# Patient Record
Sex: Male | Born: 1955 | Race: White | Hispanic: No | Marital: Married | Smoking: Former smoker
Health system: Southern US, Community
[De-identification: ages and names within clinical notes are randomized; demographics above are authoritative.]

## PROBLEM LIST (undated history)

## (undated) DIAGNOSIS — G8929 Other chronic pain: Secondary | ICD-10-CM

## (undated) DIAGNOSIS — I10 Essential (primary) hypertension: Secondary | ICD-10-CM

## (undated) DIAGNOSIS — M1990 Unspecified osteoarthritis, unspecified site: Secondary | ICD-10-CM

## (undated) HISTORY — DX: Essential (primary) hypertension: I10

## (undated) HISTORY — DX: Unspecified osteoarthritis, unspecified site: M19.90

## (undated) HISTORY — PX: NO PAST SURGERIES: SHX2092

## (undated) HISTORY — DX: Other chronic pain: G89.29

---
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
## 2014-06-07 ENCOUNTER — Inpatient Hospital Stay: Payer: Self-pay | Admitting: Internal Medicine

## 2014-06-07 LAB — URINALYSIS, COMPLETE
BILIRUBIN, UR: NEGATIVE
Blood: NEGATIVE
Glucose,UR: NEGATIVE mg/dL (ref 0–75)
LEUKOCYTE ESTERASE: NEGATIVE
Nitrite: NEGATIVE
Ph: 5 (ref 4.5–8.0)
RBC,UR: 2 /HPF (ref 0–5)
SPECIFIC GRAVITY: 1.023 (ref 1.003–1.030)
Squamous Epithelial: NONE SEEN

## 2014-06-07 LAB — PROTIME-INR
INR: 1
INR: 1.1
Prothrombin Time: 13.1 secs (ref 11.5–14.7)
Prothrombin Time: 13.8 secs (ref 11.5–14.7)

## 2014-06-07 LAB — COMPREHENSIVE METABOLIC PANEL
ANION GAP: 9 (ref 7–16)
Albumin: 3.6 g/dL (ref 3.4–5.0)
Alkaline Phosphatase: 52 U/L
BILIRUBIN TOTAL: 0.6 mg/dL (ref 0.2–1.0)
BUN: 14 mg/dL (ref 7–18)
CALCIUM: 8.5 mg/dL (ref 8.5–10.1)
Chloride: 106 mmol/L (ref 98–107)
Co2: 25 mmol/L (ref 21–32)
Creatinine: 1.1 mg/dL (ref 0.60–1.30)
GLUCOSE: 110 mg/dL — AB (ref 65–99)
Osmolality: 281 (ref 275–301)
Potassium: 3.1 mmol/L — ABNORMAL LOW (ref 3.5–5.1)
SGOT(AST): 20 U/L (ref 15–37)
SGPT (ALT): 26 U/L (ref 12–78)
Sodium: 140 mmol/L (ref 136–145)
Total Protein: 7 g/dL (ref 6.4–8.2)

## 2014-06-07 LAB — CBC WITH DIFFERENTIAL/PLATELET
BASOS PCT: 1.1 %
Basophil #: 0.2 10*3/uL — ABNORMAL HIGH (ref 0.0–0.1)
Basophil #: 0 10*3/uL (ref 0.0–0.1)
Basophil %: 0.3 %
EOS ABS: 0.1 10*3/uL (ref 0.0–0.7)
EOS PCT: 0.1 %
Eosinophil #: 0 10*3/uL (ref 0.0–0.7)
Eosinophil %: 0.9 %
HCT: 42.2 % (ref 40.0–52.0)
HCT: 42.8 % (ref 40.0–52.0)
HGB: 12.8 g/dL — AB (ref 13.0–18.0)
HGB: 13.4 g/dL (ref 13.0–18.0)
LYMPHS PCT: 11.3 %
Lymphocyte #: 4.3 10*3/uL — ABNORMAL HIGH (ref 1.0–3.6)
Lymphocyte #: 1.7 10*3/uL (ref 1.0–3.6)
Lymphocyte %: 29.8 %
MCH: 21.3 pg — AB (ref 26.0–34.0)
MCH: 21.5 pg — ABNORMAL LOW (ref 26.0–34.0)
MCHC: 30.3 g/dL — ABNORMAL LOW (ref 32.0–36.0)
MCHC: 31.2 g/dL — AB (ref 32.0–36.0)
MCV: 70 fL — ABNORMAL LOW (ref 80–100)
MCV: 69 fL — AB (ref 80–100)
Monocyte #: 0.9 x10 3/mm (ref 0.2–1.0)
Monocyte #: 0.9 x10 3/mm (ref 0.2–1.0)
Monocyte %: 6 %
Monocyte %: 5.9 %
NEUTROS PCT: 62.2 %
NEUTROS PCT: 82.4 %
Neutrophil #: 8.9 10*3/uL — ABNORMAL HIGH (ref 1.4–6.5)
Neutrophil #: 12.2 10*3/uL — ABNORMAL HIGH (ref 1.4–6.5)
PLATELETS: 213 10*3/uL (ref 150–440)
Platelet: 266 10*3/uL (ref 150–440)
RBC: 6.01 10*6/uL — AB (ref 4.40–5.90)
RBC: 6.22 10*6/uL — ABNORMAL HIGH (ref 4.40–5.90)
RDW: 16.3 % — ABNORMAL HIGH (ref 11.5–14.5)
RDW: 16.2 % — ABNORMAL HIGH (ref 11.5–14.5)
WBC: 14.4 10*3/uL — ABNORMAL HIGH (ref 3.8–10.6)
WBC: 14.8 10*3/uL — ABNORMAL HIGH (ref 3.8–10.6)

## 2014-06-07 LAB — D-DIMER(ARMC): D-Dimer: 438 ng/ml

## 2014-06-07 LAB — FIBRINOGEN
Fibrinogen: 226 mg/dL (ref 210–470)
Fibrinogen: 222 mg/dL (ref 210–470)

## 2014-06-08 LAB — COMPREHENSIVE METABOLIC PANEL
ALK PHOS: 48 U/L
ALT: 19 U/L (ref 12–78)
Albumin: 3.1 g/dL — ABNORMAL LOW (ref 3.4–5.0)
Anion Gap: 6 — ABNORMAL LOW (ref 7–16)
BUN: 9 mg/dL (ref 7–18)
Bilirubin,Total: 0.9 mg/dL (ref 0.2–1.0)
CREATININE: 0.95 mg/dL (ref 0.60–1.30)
Calcium, Total: 7.8 mg/dL — ABNORMAL LOW (ref 8.5–10.1)
Chloride: 105 mmol/L (ref 98–107)
Co2: 24 mmol/L (ref 21–32)
EGFR (Non-African Amer.): >60
Glucose: 106 mg/dL — ABNORMAL HIGH (ref 65–99)
Osmolality: 269 (ref 275–301)
POTASSIUM: 3.7 mmol/L (ref 3.5–5.1)
SGOT(AST): 17 U/L (ref 15–37)
Sodium: 135 mmol/L — ABNORMAL LOW (ref 136–145)
TOTAL PROTEIN: 6.1 g/dL — AB (ref 6.4–8.2)

## 2014-06-08 LAB — CBC WITH DIFFERENTIAL/PLATELET
BASOS ABS: 0.1 10*3/uL (ref 0.0–0.1)
BASOS PCT: 0.8 %
EOS PCT: 0.9 %
Eosinophil #: 0.1 10*3/uL (ref 0.0–0.7)
HCT: 42.2 % (ref 40.0–52.0)
HGB: 12.9 g/dL — AB (ref 13.0–18.0)
LYMPHS ABS: 3.3 10*3/uL (ref 1.0–3.6)
Lymphocyte %: 26.2 %
MCH: 21.3 pg — AB (ref 26.0–34.0)
MCHC: 30.7 g/dL — ABNORMAL LOW (ref 32.0–36.0)
MCV: 69 fL — AB (ref 80–100)
MONOS PCT: 7.4 %
Monocyte #: 0.9 x10 3/mm (ref 0.2–1.0)
NEUTROS ABS: 8.2 10*3/uL — AB (ref 1.4–6.5)
Neutrophil %: 64.7 %
PLATELETS: 205 10*3/uL (ref 150–440)
RBC: 6.08 10*6/uL — ABNORMAL HIGH (ref 4.40–5.90)
RDW: 15.7 % — ABNORMAL HIGH (ref 11.5–14.5)
WBC: 12.7 10*3/uL — AB (ref 3.8–10.6)

## 2014-06-08 LAB — FIBRINOGEN: FIBRINOGEN: 246 mg/dL (ref 210–470)

## 2014-06-08 LAB — PROTIME-INR
INR: 1
Prothrombin Time: 13.3 secs (ref 11.5–14.7)

## 2014-07-05 ENCOUNTER — Ambulatory Visit: Payer: Self-pay | Attending: Internal Medicine

## 2015-03-16 NOTE — Discharge Summary (Signed)
PATIENT NAME:  Todd BrickRENO, Todd Mcmahon DATE OF BIRTH:  03-16-1956  DATE OF ADMISSION:  06/07/2014 DATE OF DISCHARGE:  06/08/2014  PRESENTING COMPLAINT: Snakebite.   DISCHARGE DIAGNOSES:  1.  Snakebite.  2.  Right hand fifth finger small hemorrhagic blister.   CODE STATUS: FULL CODE.   MEDICATIONS:  1.  Keflex 500 mg b.i.d.  2.  Tylenol/hydrocodone 5/325 one tablet every 4 hours as needed.   FOLLOWUP:  Follow up with urgent care or ER if needed.   LABORATORY DATA:  White count is 12.7, hemoglobin and hematocrit is 12.9 and 42.2, platelet count is 205,000. PT/INR within normal limits. Fibrinogen within normal limits. Urinalysis negative for urinary tract infection. D-dimer is 438.   BRIEF SUMMARY OF HOSPITAL COURSE: [name] is a 59 year old Caucasian gentleman who is a Corporate investment bankerconstruction worker by occupation, who came into the Emergency Room from after he was bitten by a snake on the right fourth finger while he was at work trying to move lumber around.  Patient started having swelling and tingling along with stinging pain in the right finger, came to the Emergency Room, was admitted since his hand appeared swollen and had  developed a hemorrhagic blister. The patient was admitted on the medical floor, received some IV fluids and he also received CroFab antivenom, according to poison control recommendations. His coagulations were monitored.  PT/INR and fibrinogen remained stable. For the patient's swelling, it was recommended to continue to keep his hand elevated. He developed a hemorrhagic blister, which was oozing some.  Did not have any fever, had mild elevated white count, hence Keflex was started empirically which the patient would complete the course. The patient had good radial pulse. There were no signs of compartment syndrome. He was recommended to follow-up in the urgent care or come back to the Emergency Room in case his signed and symptoms worsened. The patient did voice  understanding. He was discharged to home in stable condition. He remained a full code.    TIME SPENT:  40 minutes.   ____________________________ Wylie HailSona A. Allena KatzPatel, MD sap:ts D: 06/09/2014 07:09:42 ET T: 06/09/2014 11:01:35 ET JOB#: [id number]  cc: Darian Cansler A. Allena KatzPatel, MD, <Dictator> Willow OraSONA A Walfred Bettendorf MD ELECTRONICALLY SIGNED 06/19/2014 15:42

## 2015-03-16 NOTE — H&P (Signed)
PATIENT NAME:  Denton BrickRENO, Johney J MR#:  [id number] DATE OF BIRTH:  [birth year]  DATE OF ADMISSION:  06/07/2014  PRIMARY CARE PHYSICIAN: None.  REFERRING PHYSICIAN: Lurena JoinerRebecca L. Lord, MD.  CHIEF COMPLAINT: Snakebite.   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  Today, was working with some lumber in [employer] representative[employer] site and while moving the lumber, he saw a snake underneath, which was a copperhead as he knows it, tried to move fast, but the snake bit him on the 4th finger on his right hand.  Immediately, within  10-15 minutes, he started having swelling of his hand.  He tried to keep his arm and his hand under  running water, but did not help much. He had severe her pain as if hundreds of bees stung his finger and hand, and so decided to come to the Emergency Room.  Within like half an hour, he was in the Emergency Room, and after arrival in ER, his hand continued to keep swelling more and more and it was spreading on his forearm. ER physician spoke to poison control center and started him on CroFab and checked his initial laboratories.  After starting his CroFab, his swelling is gradually going down, so given to hospitalist team for further management of this case.   REVIEW OF SYSTEMS:  CONSTITUTIONAL: Negative for fever, fatigue, weakness, pain or weight loss.  EYES: No blurring, double vision, discharge or redness.  ENT: No tinnitus, ear pain or hearing loss.  RESPIRATORY: No cough, wheezing, hemoptysis, or shortness of breath.  CARDIOVASCULAR: No chest pain, orthopnea, edema, arrhythmia, palpitations.  GASTROINTESTINAL: No nausea, vomiting, diarrhea, abdominal pain.  GENITOURINARY:  No dysuria, hematuria, or increased frequency.  ENDOCRINE: No heat or cold intolerance. No excessive sweating.   SKIN: No acne, rashes, or lesions, but has snakebite on 4th finger on right hand and severe swelling of right hand up to forearm.  MUSCULOSKELETAL: As mentioned above, swelling of the right hand and forearm.   NEUROLOGIC: No numbness, except his right hand which feels numb because of severe swelling. No vertigo, weakness or tingling.   PAST MEDICAL HISTORY: None, not known as does not go to a doctor.   PAST SURGICAL HISTORY: None.   SOCIAL HISTORY: He is a Corporate investment bankerconstruction worker, lives with family. He smokes 1 pack in 2 days and he drinks 1-2 beers every day.  Sometimes when he has back pain, he drinks 5-6 beers to get rid of his pain. He denies any illegal drug use.  He does not have insurance.   FAMILY HISTORY: Positive for colon cancer in his father at age of 60-70 and mother had breast cancer which spread all over.   HOME MEDICATIONS: None.   PHYSICAL EXAMINATION: VITAL SIGNS: In ER, temperature 97.8, pulse is 54, respirations 18, blood pressure 139/90, and pulse oximetry 100% on room air.  GENERAL: The patient is fully alert and oriented to time, place and person. Does not appear in any acute distress.  HEAD:  Atraumatic. Conjunctivae pink.  Oral mucosa  moist.  NECK: Supple. No JVD.  RESPIRATORY: Bilateral equal and clear air entry.  CARDIOVASCULAR: S1, S2 present, regular. No murmur.  ABDOMEN: Soft, nontender. Bowel sounds present. No organomegaly.  SKIN: No rashes, except right hand where there is  sting/bite by snake and somewhat purplish discoloration of his 4th finger with severe swelling on his hand and forearm.  MUSCULOSKELETAL: As mentioned above on right hand and forearm; otherwise, no joint swelling or tenderness.  NEUROLOGICAL: Power 5/5. Moves all 4 limbs except right hand, which is limited movement because of swelling and he is numb on the fingers and hand.  Pulsations present on the right side for radial artery. No tremor or rigidity.  PSYCHIATRIC: Does not appear in any  acute psychiatric illness at this time.   IMPORTANT LABORATORY RESULTS: Glucose 110, BUN 14, creatinine 1.10, sodium 140, potassium is 3.1, chloride 106, CO2 25. Calcium 8.5, total protein 7, bilirubin 0.6, alkaline phosphate 52, SGOT 20 and SGPT is 26. WBC 14.4, hemoglobin 12.8, platelet count is 266,000, and MCV is 70. INR 1 and fibrinogen is 226.  Urinalysis is negative.   ASSESSMENT AND PLAN: A 59 year old man with no known past medical history, came to Emergency Room after the bite by copperhead snake, severe swelling and pain on his right hand, already started on antisnake venom and spoke to [employer].  1.  Snake bite copperhead poisonous snake. Started on CroFab antisnake venom by ER,  According to the recommendation of Poison Control Center and his swelling is already coming down on the right hand.  As [employer] suggested, we will check CBC, INR and fibrinogen every  6 hours and if his levels get worse or his swelling get worse, we need to call the [facility] and maybe restart a second drip of antisnake venom. Otherwise, he we can just keep him on observation.  After finishing this drip, checking every 6 hours CBC, INR and fibrinogen if it is fine, he  should be able to discharge tomorrow.  2.  Severe pain due to snake bite.  Will give morphine injection for this pain control.  3.  Hypokalemia. We will give him for potassium oral and check it tomorrow.  4.  Smoking. Smoking cessation counseling is done for 4 minutes and offered a nicotine patch. He is okay without smoking and does not want nicotine patch here.   CODE STATUS: Full code.   TOTAL TIME SPENT ON THIS ADMISSION: 50 minutes    ____________________________ Hope Pigeon Elisabeth Pigeon, MD vgv:ds D: 06/07/2014 16:24:08 ET T: 06/07/2014 17:13:49 ET JOB#: [id number]  cc: Hope Pigeon. Elisabeth Pigeon, MD, <Dictator> Altamese Dilling MD ELECTRONICALLY SIGNED 06/08/2014 12:40

## 2020-02-03 ENCOUNTER — Ambulatory Visit: Payer: Self-pay | Attending: Internal Medicine

## 2020-02-03 ENCOUNTER — Other Ambulatory Visit: Payer: Self-pay

## 2020-02-03 DIAGNOSIS — Z23 Encounter for immunization: Secondary | ICD-10-CM

## 2020-02-03 NOTE — Progress Notes (Signed)
   Covid-19 Vaccination Clinic  Name:  Todd Mcmahon    MRN: [id number] DOB: 03/28/1956  02/03/2020  [name] was observed post Covid-19 immunization for 15 minutes without incident. He was provided with Vaccine Information Sheet and instruction to access the V-Safe system.   [name] was instructed to call 911 with any severe reactions post vaccine: Marland Kitchen Difficulty breathing  . Swelling of face and throat  . A fast heartbeat  . A bad rash all over body  . Dizziness and weakness   Immunizations Administered    Name Date Dose VIS Date Route   Pfizer COVID-19 Vaccine 02/03/2020 10:27 AM 0.3 mL 11/03/2019 Intramuscular   Manufacturer: [employer], [employer]   Lot: [id number]   NDC: [address]-5

## 2020-02-28 ENCOUNTER — Ambulatory Visit: Payer: Self-pay | Attending: Internal Medicine

## 2020-02-28 DIAGNOSIS — Z23 Encounter for immunization: Secondary | ICD-10-CM

## 2020-02-28 NOTE — Progress Notes (Signed)
   Covid-19 Vaccination Clinic  Name:  Todd Mcmahon    MRN: [id number] DOB: 1956/03/03  02/28/2020  [name] was observed post Covid-19 immunization for 15 minutes without incident. He was provided with Vaccine Information Sheet and instruction to access the V-Safe system.   [name] was instructed to call 911 with any severe reactions post vaccine: Marland Kitchen Difficulty breathing  . Swelling of face and throat  . A fast heartbeat  . A bad rash all over body  . Dizziness and weakness   Immunizations Administered    Name Date Dose VIS Date Route   Pfizer COVID-19 Vaccine 02/28/2020 10:25 AM 0.3 mL 11/03/2019 Intramuscular   Manufacturer: [employer], [employer]   Lot: [id number]   NDC: [address]-7

## 2020-10-30 ENCOUNTER — Telehealth: Payer: Self-pay | Admitting: General Practice

## 2020-10-30 NOTE — Telephone Encounter (Signed)
Ok to schedule in open slot  Will likely need to wait to 2022 for availability. Thanks!

## 2020-10-30 NOTE — Telephone Encounter (Signed)
Pt wife called in wanted to know if he [facility] will accept him due to his wife and child goes there [facility])

## 2020-11-05 ENCOUNTER — Encounter: Payer: Self-pay | Admitting: Family Medicine

## 2020-11-05 NOTE — Telephone Encounter (Signed)
Updated pt's chart.  

## 2021-01-10 ENCOUNTER — Encounter: Payer: Self-pay | Admitting: Family Medicine

## 2021-01-10 ENCOUNTER — Ambulatory Visit (INDEPENDENT_AMBULATORY_CARE_PROVIDER_SITE_OTHER): Payer: Medicare HMO | Admitting: Family Medicine

## 2021-01-10 ENCOUNTER — Other Ambulatory Visit: Payer: Self-pay

## 2021-01-10 VITALS — BP 164/84 | HR 67 | Temp 97.7°F | Ht 67.5 in | Wt 177.1 lb

## 2021-01-10 DIAGNOSIS — F17200 Nicotine dependence, unspecified, uncomplicated: Secondary | ICD-10-CM

## 2021-01-10 DIAGNOSIS — R030 Elevated blood-pressure reading, without diagnosis of hypertension: Secondary | ICD-10-CM

## 2021-01-10 DIAGNOSIS — M545 Low back pain, unspecified: Secondary | ICD-10-CM

## 2021-01-10 DIAGNOSIS — M1990 Unspecified osteoarthritis, unspecified site: Secondary | ICD-10-CM

## 2021-01-10 DIAGNOSIS — G8929 Other chronic pain: Secondary | ICD-10-CM

## 2021-01-10 NOTE — Progress Notes (Signed)
Patient ID: Todd Mcmahon, male    DOB: 1956/03/04, 65 y.o.   MRN: [id number]  This visit was conducted in person.  BP (!) 164/84 (BP Location: Right Arm, Cuff Size: Large)   Pulse 67   Temp 97.7 F (36.5 C) (Temporal)   Ht 5' 7.5" (1.715 m)   Wt 177 lb 2 oz (80.3 kg)   SpO2 96%   BMI 27.33 kg/m    CC: new pt to establish care  Subjective:   HPI: Todd Mcmahon is a 65 y.o. male presenting on 01/10/2021 for New Patient (Initial Visit)   I see patient's wife and daughter.  Has not seen doctor in a long time.  Just received Medicare.   Hypertension - no known h/o hypertension. No HA, vision changes, CP/tightness, SOB, leg swelling.   Chronic arthritis and back pain ?gout.   Current 1/2 ppd every day smoker - started age 23 yo (~24 PY hx). Has quit intermittently. Has tried gum and other OTC NRT. Patches seemed to help.   H/o GI bleed 1978 while on ibuprofen.  Remotely told has spondylolisthesis.   Lives with wife Todd Mcmahon Occ: Builder Edu: HS  Activity: stays active at work Diet: good water, fruits/vegetables daily.      Relevant past medical, surgical, family and social history reviewed and updated as indicated. Interim medical history since our last visit reviewed. Allergies and medications reviewed and updated. No outpatient medications prior to visit.   No facility-administered medications prior to visit.     Per HPI unless specifically indicated in ROS section below Review of Systems Objective:  BP (!) 164/84 (BP Location: Right Arm, Cuff Size: Large)   Pulse 67   Temp 97.7 F (36.5 C) (Temporal)   Ht 5' 7.5" (1.715 m)   Wt 177 lb 2 oz (80.3 kg)   SpO2 96%   BMI 27.33 kg/m   Wt Readings from Last 3 Encounters:  01/10/21 177 lb 2 oz (80.3 kg)      Physical Exam Vitals and nursing note reviewed.  Constitutional:      Appearance: Normal appearance. He is not ill-appearing.  Eyes:     Extraocular Movements: Extraocular movements intact.      Conjunctiva/sclera: Conjunctivae normal.     Pupils: Pupils are equal, round, and reactive to light.  Neck:     Thyroid: No thyroid mass, thyromegaly or thyroid tenderness.  Cardiovascular:     Rate and Rhythm: Normal rate and regular rhythm.     Pulses: Normal pulses.     Heart sounds: Normal heart sounds. No murmur heard.   Pulmonary:     Effort: Pulmonary effort is normal. No respiratory distress.     Breath sounds: Normal breath sounds. No wheezing, rhonchi or rales.  Musculoskeletal:     Cervical back: Normal range of motion and neck supple.     Right lower leg: No edema.     Left lower leg: No edema.  Skin:    General: Skin is warm and dry.     Findings: No rash.  Neurological:     Mental Status: He is alert.  Psychiatric:        Mood and Affect: Mood normal.        Behavior: Behavior normal.       No results found for this or any previous visit.  Depression screen [facility] 2/9 01/10/2021  Decreased Interest 0  Down, Depressed, Hopeless 0  PHQ - 2 Score 0  Altered sleeping  1  Tired, decreased energy 0  Change in appetite 0  Feeling bad or failure about yourself  0  Trouble concentrating 0  Moving slowly or fidgety/restless 0  Suicidal thoughts 0  PHQ-9 Score 1    GAD 7 : Generalized Anxiety Score 01/10/2021  Nervous, Anxious, on Edge 0  Control/stop worrying 0  Worry too much - different things 0  Trouble relaxing 0  Restless 0  Easily annoyed or irritable 0  Afraid - awful might happen 0  Total GAD 7 Score 0   Assessment & Plan:  This visit occurred during the SARS-CoV-2 public health emergency.  Safety protocols were in place, including screening questions prior to the visit, additional usage of staff PPE, and extensive cleaning of exam room while observing appropriate contact time as indicated for disinfecting solutions.   Problem List Items Addressed This Visit    Smoker    Current 1/2 ppd smoker for ~50 yrs - will qualify for lung cancer screening [place]  program - will review next visit.  Contemplative - discussed retrial nicotine patches.       Elevated systolic blood pressure reading without diagnosis of hypertension - Primary    No recent medical evaluation. BP remaining elevated in office today. Reviewed hypertension as well as reasons to treat. rec low sodium high potassium diet and regular aerobic exercises to help lower BP. Provided DASH diet handout. Advised buy BP cuff and start monitoring at home, let me know if persistently >!40/90 to start antihypertensive. Reassess at next visit - welcome to medicare visit in 1-2 months.       Chronic back pain    Endorses remote h/o spondylolisthesis      Arthritis    Chronic, does not take medication for this.  Will need to r/o gout.           No orders of the defined types were placed in this encounter.  No orders of the defined types were placed in this encounter.   Patient instructions: Try nicoderm CQ 14 mg patch daily to help quit smoking.  Return for welcome to medicare visit in 1-2 months.  Blood pressures is staying too high. Your goal blood pressure is 140<90. Work on low salt/sodium diet - goal <1.5gm (1,500mg ) per day. Eat a diet high in fruits/vegetables and whole grains.  Look into mediterranean and DASH diet.  Goal activity is 125min/wk of moderate intensity exercise.  This can be split into 30 minute chunks.  If you are not at this level, you can start with smaller 10-15 min increments and slowly build up activity. Look at www.heart.org for more resources   Follow up plan: Return in about 6 weeks (around 02/21/2021), or if symptoms worsen or fail to improve, for annual exam, prior fasting for blood work.  Todd Boyden, MD

## 2021-01-10 NOTE — Patient Instructions (Addendum)
Try nicoderm CQ 14 mg patch daily to help quit smoking.  Return for welcome to medicare visit in 1-2 months.  Blood pressures is staying too high. Your goal blood pressure is 140<90. Work on low salt/sodium diet - goal <1.5gm (1,500mg ) per day. Eat a diet high in fruits/vegetables and whole grains.  Look into mediterranean and DASH diet.  Goal activity is 175min/wk of moderate intensity exercise.  This can be split into 30 minute chunks.  If you are not at this level, you can start with smaller 10-15 min increments and slowly build up activity. Look at www.heart.org for more resources   https://www.mata.com/.pdf">  DASH Eating Plan DASH stands for Dietary Approaches to Stop Hypertension. The DASH eating plan is a healthy eating plan that has been shown to:  Reduce high blood pressure (hypertension).  Reduce your risk for type 2 diabetes, heart disease, and stroke.  Help with weight loss. What are tips for following this plan? Reading food labels  Check food labels for the amount of salt (sodium) per serving. Choose foods with less than 5 percent of the Daily Value of sodium. Generally, foods with less than 300 milligrams (mg) of sodium per serving fit into this eating plan.  To find whole grains, look for the word "whole" as the first word in the ingredient list. Shopping  Buy products labeled as "low-sodium" or "no salt added."  Buy fresh foods. Avoid canned foods and pre-made or frozen meals. Cooking  Avoid adding salt when cooking. Use salt-free seasonings or herbs instead of table salt or sea salt. Check with your health care provider or pharmacist before using salt substitutes.  Do not fry foods. Cook foods using healthy methods such as baking, boiling, grilling, roasting, and broiling instead.  Cook with heart-healthy oils, such as olive, canola, avocado, soybean, or sunflower oil. Meal planning  Eat a balanced diet that  includes: ? 4 or more servings of fruits and 4 or more servings of vegetables each day. Try to fill one-half of your plate with fruits and vegetables. ? 6-8 servings of whole grains each day. ? Less than 6 oz (170 g) of lean meat, poultry, or fish each day. A 3-oz (85-g) serving of meat is about the same size as a deck of cards. One egg equals 1 oz (28 g). ? 2-3 servings of low-fat dairy each day. One serving is 1 cup (237 mL). ? 1 serving of nuts, seeds, or beans 5 times each week. ? 2-3 servings of heart-healthy fats. Healthy fats called omega-3 fatty acids are found in foods such as walnuts, flaxseeds, fortified milks, and eggs. These fats are also found in cold-water fish, such as sardines, salmon, and mackerel.  Limit how much you eat of: ? Canned or prepackaged foods. ? Food that is high in trans fat, such as some fried foods. ? Food that is high in saturated fat, such as fatty meat. ? Desserts and other sweets, sugary drinks, and other foods with added sugar. ? Full-fat dairy products.  Do not salt foods before eating.  Do not eat more than 4 egg yolks a week.  Try to eat at least 2 vegetarian meals a week.  Eat more home-cooked food and less restaurant, buffet, and fast food.   Lifestyle  When eating at a restaurant, ask that your food be prepared with less salt or no salt, if possible.  If you drink alcohol: ? Limit how much you use to:  0-1 drink a day for women  who are not pregnant.  0-2 drinks a day for men. ? Be aware of how much alcohol is in your drink. In the [place] one drink equals one 12 oz bottle of beer (355 mL), one 5 oz glass of wine (148 mL), or one 1 oz glass of hard liquor (44 mL). General information  Avoid eating more than 2,300 mg of salt a day. If you have hypertension, you may need to reduce your sodium intake to 1,500 mg a day.  Work with your health care provider to maintain a healthy body weight or to lose weight. Ask what an ideal weight is for  you.  Get at least 30 minutes of exercise that causes your heart to beat faster (aerobic exercise) most days of the week. Activities may include walking, swimming, or biking.  Work with your health care provider or dietitian to adjust your eating plan to your individual calorie needs. What foods should I eat? Fruits All fresh, dried, or frozen fruit. Canned fruit in natural juice (without added sugar). Vegetables Fresh or frozen vegetables (raw, steamed, roasted, or grilled). Low-sodium or reduced-sodium tomato and vegetable juice. Low-sodium or reduced-sodium tomato sauce and tomato paste. Low-sodium or reduced-sodium canned vegetables. Grains Whole-grain or whole-wheat bread. Whole-grain or whole-wheat pasta. Brown rice. Orpah Cobb. Bulgur. Whole-grain and low-sodium cereals. Pita bread. Low-fat, low-sodium crackers. Whole-wheat flour tortillas. Meats and other proteins Skinless chicken or [place]. Ground chicken or [place]. Pork with fat trimmed off. Fish and seafood. Egg whites. Dried beans, peas, or lentils. Unsalted nuts, nut butters, and seeds. Unsalted canned beans. Lean cuts of beef with fat trimmed off. Low-sodium, lean precooked or cured meat, such as sausages or meat loaves. Dairy Low-fat (1%) or fat-free (skim) milk. Reduced-fat, low-fat, or fat-free cheeses. Nonfat, low-sodium ricotta or cottage cheese. Low-fat or nonfat yogurt. Low-fat, low-sodium cheese. Fats and oils Soft margarine without trans fats. Vegetable oil. Reduced-fat, low-fat, or light mayonnaise and salad dressings (reduced-sodium). Canola, safflower, olive, avocado, soybean, and sunflower oils. Avocado. Seasonings and condiments Herbs. Spices. Seasoning mixes without salt. Other foods Unsalted popcorn and pretzels. Fat-free sweets. The items listed above may not be a complete list of foods and beverages you can eat. Contact a dietitian for more information. What foods should I avoid? Fruits Canned fruit in a  light or heavy syrup. Fried fruit. Fruit in cream or butter sauce. Vegetables Creamed or fried vegetables. Vegetables in a cheese sauce. Regular canned vegetables (not low-sodium or reduced-sodium). Regular canned tomato sauce and paste (not low-sodium or reduced-sodium). Regular tomato and vegetable juice (not low-sodium or reduced-sodium). Rosita Fire. Olives. Grains Baked goods made with fat, such as croissants, muffins, or some breads. Dry pasta or rice meal packs. Meats and other proteins Fatty cuts of meat. Ribs. Fried meat. Tomasa Blase. Bologna, salami, and other precooked or cured meats, such as sausages or meat loaves. Fat from the back of a pig (fatback). Bratwurst. Salted nuts and seeds. Canned beans with added salt. Canned or smoked fish. Whole eggs or egg yolks. Chicken or [place] with skin. Dairy Whole or 2% milk, cream, and half-and-half. Whole or full-fat cream cheese. Whole-fat or sweetened yogurt. Full-fat cheese. Nondairy creamers. Whipped toppings. Processed cheese and cheese spreads. Fats and oils Butter. Stick margarine. Lard. Shortening. Ghee. Bacon fat. Tropical oils, such as coconut, palm kernel, or palm oil. Seasonings and condiments Onion salt, garlic salt, seasoned salt, table salt, and sea salt. Worcestershire sauce. Tartar sauce. Barbecue sauce. Teriyaki sauce. Soy sauce, including reduced-sodium. Steak sauce. Canned and  packaged gravies. Fish sauce. Oyster sauce. Cocktail sauce. Store-bought horseradish. Ketchup. Mustard. Meat flavorings and tenderizers. Bouillon cubes. Hot sauces. Pre-made or packaged marinades. Pre-made or packaged taco seasonings. Relishes. Regular salad dressings. Other foods Salted popcorn and pretzels. The items listed above may not be a complete list of foods and beverages you should avoid. Contact a dietitian for more information. Where to find more information  National Heart, Lung, and Blood Institute: PopSteam.is  American Heart Association:  www.heart.org  Academy of Nutrition and Dietetics: www.eatright.org  National Kidney Foundation: www.kidney.org Summary  The DASH eating plan is a healthy eating plan that has been shown to reduce high blood pressure (hypertension). It may also reduce your risk for type 2 diabetes, heart disease, and stroke.  When on the DASH eating plan, aim to eat more fresh fruits and vegetables, whole grains, lean proteins, low-fat dairy, and heart-healthy fats.  With the DASH eating plan, you should limit salt (sodium) intake to 2,300 mg a day. If you have hypertension, you may need to reduce your sodium intake to 1,500 mg a day.  Work with your health care provider or dietitian to adjust your eating plan to your individual calorie needs. This information is not intended to replace advice given to you by your health care provider. Make sure you discuss any questions you have with your health care provider. Document Revised: 10/13/2019 Document Reviewed: 10/13/2019 Elsevier Patient Education  2021 [employer].

## 2021-01-11 ENCOUNTER — Encounter: Payer: Self-pay | Admitting: Family Medicine

## 2021-01-11 DIAGNOSIS — M1990 Unspecified osteoarthritis, unspecified site: Secondary | ICD-10-CM | POA: Insufficient documentation

## 2021-01-11 DIAGNOSIS — R030 Elevated blood-pressure reading, without diagnosis of hypertension: Secondary | ICD-10-CM | POA: Insufficient documentation

## 2021-01-11 DIAGNOSIS — I10 Essential (primary) hypertension: Secondary | ICD-10-CM | POA: Insufficient documentation

## 2021-01-11 DIAGNOSIS — G8929 Other chronic pain: Secondary | ICD-10-CM | POA: Insufficient documentation

## 2021-01-11 NOTE — Assessment & Plan Note (Signed)
Endorses remote h/o spondylolisthesis

## 2021-01-11 NOTE — Assessment & Plan Note (Addendum)
Current 1/2 ppd smoker for ~50 yrs - will qualify for lung cancer screening [place] program - will review next visit.  Contemplative - discussed retrial nicotine patches.

## 2021-01-11 NOTE — Assessment & Plan Note (Signed)
Chronic, does not take medication for this.  Will need to r/o gout.

## 2021-01-11 NOTE — Assessment & Plan Note (Signed)
No recent medical evaluation. BP remaining elevated in office today. Reviewed hypertension as well as reasons to treat. rec low sodium high potassium diet and regular aerobic exercises to help lower BP. Provided DASH diet handout. Advised buy BP cuff and start monitoring at home, let me know if persistently >!40/90 to start antihypertensive. Reassess at next visit - welcome to medicare visit in 1-2 months.

## 2021-02-08 ENCOUNTER — Encounter: Payer: Self-pay | Admitting: Family Medicine

## 2021-02-11 NOTE — Telephone Encounter (Signed)
Form printed and placed in your in box

## 2021-02-14 NOTE — Telephone Encounter (Signed)
Faxed form.  Made copy to scan and mailed original to pt.

## 2021-03-23 DIAGNOSIS — U071 COVID-19: Secondary | ICD-10-CM

## 2021-03-23 HISTORY — DX: COVID-19: U07.1

## 2021-04-18 DIAGNOSIS — Z20822 Contact with and (suspected) exposure to covid-19: Secondary | ICD-10-CM | POA: Diagnosis not present

## 2021-04-19 ENCOUNTER — Encounter: Payer: Self-pay | Admitting: Family Medicine

## 2021-04-22 ENCOUNTER — Encounter: Payer: Self-pay | Admitting: Family Medicine

## 2021-04-22 NOTE — Telephone Encounter (Signed)
Noted.  FYI to Dr. G. 

## 2021-04-22 NOTE — Telephone Encounter (Signed)
Pt declined appointment at this time. Wife states he is not having any symptoms and does not feel he needs appointment.

## 2021-04-22 NOTE — Telephone Encounter (Addendum)
Plz add pt today as MyChart visit at 1:00.  Pt not aware.  (Dr. Reece Agar will see pt and wife together.)

## 2021-07-11 ENCOUNTER — Ambulatory Visit: Payer: Medicare HMO

## 2021-07-16 ENCOUNTER — Other Ambulatory Visit: Payer: Self-pay | Admitting: Family Medicine

## 2021-07-16 DIAGNOSIS — Z125 Encounter for screening for malignant neoplasm of prostate: Secondary | ICD-10-CM

## 2021-07-16 DIAGNOSIS — Z131 Encounter for screening for diabetes mellitus: Secondary | ICD-10-CM

## 2021-07-16 DIAGNOSIS — D509 Iron deficiency anemia, unspecified: Secondary | ICD-10-CM | POA: Insufficient documentation

## 2021-07-16 DIAGNOSIS — Z13220 Encounter for screening for lipoid disorders: Secondary | ICD-10-CM

## 2021-07-16 DIAGNOSIS — D563 Thalassemia minor: Secondary | ICD-10-CM | POA: Insufficient documentation

## 2021-07-16 DIAGNOSIS — R718 Other abnormality of red blood cells: Secondary | ICD-10-CM

## 2021-07-16 DIAGNOSIS — Z1159 Encounter for screening for other viral diseases: Secondary | ICD-10-CM

## 2021-07-18 ENCOUNTER — Other Ambulatory Visit (INDEPENDENT_AMBULATORY_CARE_PROVIDER_SITE_OTHER): Payer: Medicare HMO

## 2021-07-18 ENCOUNTER — Other Ambulatory Visit: Payer: Self-pay

## 2021-07-18 DIAGNOSIS — Z13220 Encounter for screening for lipoid disorders: Secondary | ICD-10-CM | POA: Diagnosis not present

## 2021-07-18 DIAGNOSIS — R718 Other abnormality of red blood cells: Secondary | ICD-10-CM | POA: Diagnosis not present

## 2021-07-18 DIAGNOSIS — Z1159 Encounter for screening for other viral diseases: Secondary | ICD-10-CM | POA: Diagnosis not present

## 2021-07-18 DIAGNOSIS — Z131 Encounter for screening for diabetes mellitus: Secondary | ICD-10-CM

## 2021-07-18 DIAGNOSIS — Z125 Encounter for screening for malignant neoplasm of prostate: Secondary | ICD-10-CM

## 2021-07-18 LAB — BASIC METABOLIC PANEL
BUN: 15 mg/dL (ref 6–23)
CO2: 28 mEq/L (ref 19–32)
Calcium: 9.1 mg/dL (ref 8.4–10.5)
Chloride: 102 mEq/L (ref 96–112)
Creatinine, Ser: 0.95 mg/dL (ref 0.40–1.50)
GFR: 83.91 mL/min (ref >60.00)
Glucose, Bld: 111 mg/dL — ABNORMAL HIGH (ref 70–99)
Potassium: 5.4 mEq/L — ABNORMAL HIGH (ref 3.5–5.1)
Sodium: 138 mEq/L (ref 135–145)

## 2021-07-18 LAB — PSA: PSA: 1.83 ng/mL (ref 0.10–4.00)

## 2021-07-18 LAB — LIPID PANEL
Cholesterol: 142 mg/dL (ref 0–200)
HDL: 57.2 mg/dL (ref >39.00)
LDL Cholesterol: 71 mg/dL (ref 0–99)
NonHDL: 85.16
Total CHOL/HDL Ratio: 2
Triglycerides: 71 mg/dL (ref 0.0–149.0)
VLDL: 14.2 mg/dL (ref 0.0–40.0)

## 2021-07-18 LAB — CBC WITH DIFFERENTIAL/PLATELET
Basophils Absolute: 0 10*3/uL (ref 0.0–0.1)
Basophils Relative: 0.5 % (ref 0.0–3.0)
Eosinophils Absolute: 0.1 10*3/uL (ref 0.0–0.7)
Eosinophils Relative: 1.2 % (ref 0.0–5.0)
HCT: 39.1 % (ref 39.0–52.0)
Hemoglobin: 12 g/dL — ABNORMAL LOW (ref 13.0–17.0)
Lymphocytes Relative: 24.8 % (ref 12.0–46.0)
Lymphs Abs: 2.2 10*3/uL (ref 0.7–4.0)
MCHC: 30.8 g/dL (ref 30.0–36.0)
MCV: 71.2 fl — ABNORMAL LOW (ref 78.0–100.0)
Monocytes Absolute: 0.9 10*3/uL (ref 0.1–1.0)
Monocytes Relative: 9.5 % (ref 3.0–12.0)
Neutro Abs: 5.8 10*3/uL (ref 1.4–7.7)
Neutrophils Relative %: 64 % (ref 43.0–77.0)
Platelets: 295 10*3/uL (ref 150.0–400.0)
RBC: 5.5 Mil/uL (ref 4.22–5.81)
RDW: 15.7 % — ABNORMAL HIGH (ref 11.5–15.5)
WBC: 9 10*3/uL (ref 4.0–10.5)

## 2021-07-18 LAB — IBC PANEL
Iron: 33 ug/dL — ABNORMAL LOW (ref 42–165)
Saturation Ratios: 11.7 % — ABNORMAL LOW (ref 20.0–50.0)
TIBC: 281.4 ug/dL (ref 250.0–450.0)
Transferrin: 201 mg/dL — ABNORMAL LOW (ref 212.0–360.0)

## 2021-07-18 LAB — FERRITIN: Ferritin: 204.9 ng/mL (ref 22.0–322.0)

## 2021-07-21 LAB — HEPATITIS C ANTIBODY
Hepatitis C Ab: NONREACTIVE
SIGNAL TO CUT-OFF: 0.01 (ref <1.00)

## 2021-07-25 ENCOUNTER — Encounter: Payer: Self-pay | Admitting: Family Medicine

## 2021-07-25 ENCOUNTER — Ambulatory Visit (INDEPENDENT_AMBULATORY_CARE_PROVIDER_SITE_OTHER): Payer: Medicare HMO | Admitting: Family Medicine

## 2021-07-25 ENCOUNTER — Other Ambulatory Visit: Payer: Self-pay

## 2021-07-25 VITALS — BP 126/78 | HR 38 | Temp 97.6°F | Ht 68.0 in | Wt 172.0 lb

## 2021-07-25 DIAGNOSIS — K4090 Unilateral inguinal hernia, without obstruction or gangrene, not specified as recurrent: Secondary | ICD-10-CM

## 2021-07-25 DIAGNOSIS — M545 Low back pain, unspecified: Secondary | ICD-10-CM | POA: Diagnosis not present

## 2021-07-25 DIAGNOSIS — I493 Ventricular premature depolarization: Secondary | ICD-10-CM

## 2021-07-25 DIAGNOSIS — Z7189 Other specified counseling: Secondary | ICD-10-CM

## 2021-07-25 DIAGNOSIS — Z23 Encounter for immunization: Secondary | ICD-10-CM

## 2021-07-25 DIAGNOSIS — M1990 Unspecified osteoarthritis, unspecified site: Secondary | ICD-10-CM | POA: Diagnosis not present

## 2021-07-25 DIAGNOSIS — D509 Iron deficiency anemia, unspecified: Secondary | ICD-10-CM | POA: Diagnosis not present

## 2021-07-25 DIAGNOSIS — F17200 Nicotine dependence, unspecified, uncomplicated: Secondary | ICD-10-CM | POA: Diagnosis not present

## 2021-07-25 DIAGNOSIS — G8929 Other chronic pain: Secondary | ICD-10-CM

## 2021-07-25 DIAGNOSIS — R718 Other abnormality of red blood cells: Secondary | ICD-10-CM

## 2021-07-25 DIAGNOSIS — R030 Elevated blood-pressure reading, without diagnosis of hypertension: Secondary | ICD-10-CM

## 2021-07-25 DIAGNOSIS — Z0000 Encounter for general adult medical examination without abnormal findings: Secondary | ICD-10-CM | POA: Diagnosis not present

## 2021-07-25 DIAGNOSIS — Z136 Encounter for screening for cardiovascular disorders: Secondary | ICD-10-CM

## 2021-07-25 LAB — COMPREHENSIVE METABOLIC PANEL
ALT: 13 U/L (ref 0–53)
AST: 14 U/L (ref 0–37)
Albumin: 3.8 g/dL (ref 3.5–5.2)
Alkaline Phosphatase: 49 U/L (ref 39–117)
BUN: 11 mg/dL (ref 6–23)
CO2: 28 mEq/L (ref 19–32)
Calcium: 9.1 mg/dL (ref 8.4–10.5)
Chloride: 102 mEq/L (ref 96–112)
Creatinine, Ser: 0.85 mg/dL (ref 0.40–1.50)
GFR: 91.08 mL/min (ref >60.00)
Glucose, Bld: 102 mg/dL — ABNORMAL HIGH (ref 70–99)
Potassium: 4.1 mEq/L (ref 3.5–5.1)
Sodium: 137 mEq/L (ref 135–145)
Total Bilirubin: 0.6 mg/dL (ref 0.2–1.2)
Total Protein: 6.9 g/dL (ref 6.0–8.3)

## 2021-07-25 LAB — URIC ACID: Uric Acid, Serum: 8.1 mg/dL — ABNORMAL HIGH (ref 4.0–7.8)

## 2021-07-25 NOTE — Patient Instructions (Addendum)
Prevnar-20 today  Repeat labs today  We will refer you to lung cancer screening program.  We will refer you for colon cancer screening.  We will refer you to general surgery for inguinal hernias If interested, check with pharmacy about new 2 shot shingles series (shingrix).  Congratulations on almost quitting smoking!  Bring [place] a copy of your advanced directive/living will.  Return in 6 months for follow up visit.   Health Maintenance After Age 66 After age 31, you are at a higher risk for certain long-term diseases and infections as well as injuries from falls. Falls are a major cause of broken bones and head injuries in people who are older than age 6. Getting regular preventive care can help to keep you healthy and well. Preventive care includes getting regular testing and making lifestyle changes as recommended by your health care provider. Talk with your health care provider about: Which screenings and tests you should have. A screening is a test that checks for a disease when you have no symptoms. A diet and exercise plan that is right for you. What should I know about screenings and tests to prevent falls? Screening and testing are the best ways to find a health problem early. Early diagnosis and treatment give you the best chance of managing medical conditions that are common after age 52. Certain conditions and lifestyle choices may make you more likely to have a fall. Your health care provider may recommend: Regular vision checks. Poor vision and conditions such as cataracts can make you more likely to have a fall. If you wear glasses, make sure to get your prescription updated if your vision changes. Medicine review. Work with your health care provider to regularly review all of the medicines you are taking, including over-the-counter medicines. Ask your health care provider about any side effects that may make you more likely to have a fall. Tell your health care provider if any  medicines that you take make you feel dizzy or sleepy. Osteoporosis screening. Osteoporosis is a condition that causes the bones to get weaker. This can make the bones weak and cause them to break more easily. Blood pressure screening. Blood pressure changes and medicines to control blood pressure can make you feel dizzy. Strength and balance checks. Your health care provider may recommend certain tests to check your strength and balance while standing, walking, or changing positions. Foot health exam. Foot pain and numbness, as well as not wearing proper footwear, can make you more likely to have a fall. Depression screening. You may be more likely to have a fall if you have a fear of falling, feel emotionally low, or feel unable to do activities that you used to do. Alcohol use screening. Using too much alcohol can affect your balance and may make you more likely to have a fall. What actions can I take to lower my risk of falls? General instructions Talk with your health care provider about your risks for falling. Tell your health care provider if: You fall. Be sure to tell your health care provider about all falls, even ones that seem minor. You feel dizzy, sleepy, or off-balance. Take over-the-counter and prescription medicines only as told by your health care provider. These include any supplements. Eat a healthy diet and maintain a healthy weight. A healthy diet includes low-fat dairy products, low-fat (lean) meats, and fiber from whole grains, beans, and lots of fruits and vegetables. Home safety Remove any tripping hazards, such as rugs, cords, and clutter.  Install safety equipment such as grab bars in bathrooms and safety rails on stairs. Keep rooms and walkways well-lit. Activity  Follow a regular exercise program to stay fit. This will help you maintain your balance. Ask your health care provider what types of exercise are appropriate for you. If you need a cane or walker, use it as  recommended by your health care provider. Wear supportive shoes that have nonskid soles. Lifestyle Do not drink alcohol if your health care provider tells you not to drink. If you drink alcohol, limit how much you have: 0-1 drink a day for women. 0-2 drinks a day for men. Be aware of how much alcohol is in your drink. In the [place] one drink equals one typical bottle of beer (12 oz), one-half glass of wine (5 oz), or one shot of hard liquor (1 oz). Do not use any products that contain nicotine or tobacco, such as cigarettes and e-cigarettes. If you need help quitting, ask your health care provider. Summary Having a healthy lifestyle and getting preventive care can help to protect your health and wellness after age 20. Screening and testing are the best way to find a health problem early and help you avoid having a fall. Early diagnosis and treatment give you the best chance for managing medical conditions that are more common for people who are older than age 32. Falls are a major cause of broken bones and head injuries in people who are older than age 79. Take precautions to prevent a fall at home. Work with your health care provider to learn what changes you can make to improve your health and wellness and to prevent falls. This information is not intended to replace advice given to you by your health care provider. Make sure you discuss any questions you have with your health care provider. Document Revised: 01/17/2021 Document Reviewed: 10/25/2020 Elsevier Patient Education  2022 [employer].

## 2021-07-25 NOTE — Progress Notes (Signed)
Patient ID: Todd Mcmahon, male    DOB: Jan 21, 1956, 65 y.o.   MRN: [id number]  This visit was conducted in person.  BP 126/78   Pulse (!) 38   Temp 97.6 F (36.4 C) (Temporal)   Ht 5\' 8"  (1.727 m)   Wt 172 lb (78 kg)   SpO2 99%   BMI 26.15 kg/m   Pulse artificially low due to ventricular bigeminy  CC: welcome to medicare visit Subjective:   HPI: Todd Mcmahon is a 65 y.o. male presenting on 07/25/2021 for Welcome to Medicare Exam and Foot Swelling (C/o bilateral foot swelling.  Off and on for past yr. )   Hearing Screening   500Hz  1000Hz  2000Hz  4000Hz   Right ear 20 25 40 0  Left ear 25 40 20 0   Vision Screening   Right eye Left eye Both eyes  Without correction     With correction 20/20 20/25 20/20     Flowsheet Row Office Visit from 07/25/2021 in [facility] [place]  PHQ-2 Total Score 0       Fall Risk  07/25/2021  Falls in the past year? 0    Newly found microcytic anemia. Ferritin levels normal, low iron, % sat, transferrin. Of italian/english descent   Ongoing arthritis pain to lower back (since 1970s). Has been told has L4/5 spondylolisthesis as well as gout. He hangs inverted from bar every night when he gets home.   New foot swelling over the past year.  Monitors BP at home - some fluctuations noted.   Noted bigeminy today - denies palpitations, racing heart, dizziness, syncope.   Known bilat inguinal hernias present for 10 yrs - desires to postpone gen surg referral until he retires.   Preventative: Colon cancer screening - recommend colonoscopy - referral placed  Prostate cancer screening - no nocturia. PSA reassuring.  Lung cancer screening - ~25 PY hx. Discussed, will refer.  Flu shot - declines - got ill last time he received  COVID vaccine - Pfizer 01/2020, 02/2020, booster 09/2020 Tetanus shot - thinks 2015  Prevnar-20 today Shingrix - discussed - to check with pharmacy due to insurance  Advanced directive discussion -  discussed. Has this at home. Wife Todd Mcmahon would be HCPOA. Asked to bring Todd Mcmahon copy  Seat belt use discussed Sunscreen use discussed. No changing moles on skin. Sleep -  Dentist - due Eye exam - yearly Smoking - has cut down to 1-2 cig/day - congratulated. Using nicotine patch. Alcohol - 1-2 drinks/day  Bowel - no constipation  Bladder - no incontinence   Lives with wife 09/24/2021 Occ: [employer]: HS  Activity: stays active at work Diet: good water, fruits/vegetables daily.     Relevant past medical, surgical, family and social history reviewed and updated as indicated. Interim medical history since our last visit reviewed. Allergies and medications reviewed and updated. No outpatient medications prior to visit.   No facility-administered medications prior to visit.     Per HPI unless specifically indicated in ROS section below Review of Systems  Objective:  BP 126/78   Pulse (!) 38   Temp 97.6 F (36.4 C) (Temporal)   Ht 5\' 8"  (1.727 m)   Wt 172 lb (78 kg)   SpO2 99%   BMI 26.15 kg/m   Wt Readings from Last 3 Encounters:  07/25/21 172 lb (78 kg)  01/10/21 177 lb 2 oz (80.3 kg)      Physical Exam Vitals and nursing note reviewed.  Constitutional:      General: He is not in acute distress.    Appearance: Normal appearance. He is well-developed. He is not ill-appearing.  HENT:     Head: Normocephalic and atraumatic.     Right Ear: Hearing, tympanic membrane, ear canal and external ear normal.     Left Ear: Hearing, tympanic membrane, ear canal and external ear normal.  Eyes:     General: No scleral icterus.    Extraocular Movements: Extraocular movements intact.     Conjunctiva/sclera: Conjunctivae normal.     Pupils: Pupils are equal, round, and reactive to light.  Neck:     Thyroid: No thyroid mass or thyromegaly.     Vascular: No carotid bruit.  Cardiovascular:     Rate and Rhythm: Normal rate. Rhythm regularly irregular.     Pulses: Normal pulses.           Radial pulses are 2+ on the right side and 2+ on the left side.     Heart sounds: Normal heart sounds. No murmur heard.    Comments: Bigeminy  Pulmonary:     Effort: Pulmonary effort is normal. No respiratory distress.     Breath sounds: Normal breath sounds. No wheezing, rhonchi or rales.  Abdominal:     General: Bowel sounds are normal. There is no distension.     Palpations: Abdomen is soft. There is no mass.     Tenderness: There is no abdominal tenderness. There is no guarding or rebound.     Hernia: No hernia is present.  Genitourinary:    Penis: Normal and circumcised.      Testes:        Right: Mass and swelling present.        Left: Mass and swelling present.     Comments: Large inguinal hernia R>L with involvement of scrotum, no pain Musculoskeletal:        General: Normal range of motion.     Cervical back: Normal range of motion and neck supple.     Right lower leg: No edema.     Left lower leg: No edema.     Comments:  Midline bulge over superior lumbar region No foot pain/swelling appreciated today, no pain over 1st MTJP bilaterally   Lymphadenopathy:     Cervical: No cervical adenopathy.  Skin:    General: Skin is warm and dry.     Findings: No rash.  Neurological:     General: No focal deficit present.     Mental Status: He is alert and oriented to person, place, and time.     Comments:  Recall 3/3 Difficulty with calculation   Psychiatric:        Mood and Affect: Mood normal.        Behavior: Behavior normal.        Thought Content: Thought content normal.        Judgment: Judgment normal.      Results for orders placed or performed in visit on 07/25/21  Comprehensive metabolic panel  Result Value Ref Range   Sodium 137 135 - 145 mEq/L   Potassium 4.1 3.5 - 5.1 mEq/L   Chloride 102 96 - 112 mEq/L   CO2 28 19 - 32 mEq/L   Glucose, Bld 102 (H) 70 - 99 mg/dL   BUN 11 6 - 23 mg/dL   Creatinine, Ser 1.610.85 0.40 - 1.50 mg/dL   Total Bilirubin 0.6 0.2 -  1.2 mg/dL   Alkaline Phosphatase 49 39 -  117 U/L   AST 14 0 - 37 U/L   ALT 13 0 - 53 U/L   Total Protein 6.9 6.0 - 8.3 g/dL   Albumin 3.8 3.5 - 5.2 g/dL   GFR 65.78 >46.96 mL/min   Calcium 9.1 8.4 - 10.5 mg/dL  Uric acid  Result Value Ref Range   Uric Acid, Serum 8.1 (H) 4.0 - 7.8 mg/dL  CBC with Differential/Platelet  Result Value Ref Range   WBC 8.0 3.8 - 10.8 Thousand/uL   RBC 6.12 (H) 4.20 - 5.80 Million/uL   Hemoglobin 13.0 (L) 13.2 - 17.1 g/dL   HCT 29.5 28.4 - 13.2 %   MCV 73.4 (L) 80.0 - 100.0 fL   MCH 21.2 (L) 27.0 - 33.0 pg   MCHC 29.0 (L) 32.0 - 36.0 g/dL   RDW 44.0 10.2 - 72.5 %   Platelets 511 (H) 140 - 400 Thousand/uL   MPV 9.8 7.5 - 12.5 fL   Neutro Abs 4,416 1,500 - 7,800 cells/uL   Lymphs Abs 2,768 850 - 3,900 cells/uL   Absolute Monocytes 688 200 - 950 cells/uL   Eosinophils Absolute 88 15 - 500 cells/uL   Basophils Absolute 40 0 - 200 cells/uL   Neutrophils Relative % 55.2 %   Total Lymphocyte 34.6 %   Monocytes Relative 8.6 %   Eosinophils Relative 1.1 %   Basophils Relative 0.5 %   EKG - sinus rhythm ~75 with ventricular bigeminy, normal axis, intervals, no acute ST/T changes  Assessment & Plan:  This visit occurred during the SARS-CoV-2 public health emergency.  Safety protocols were in place, including screening questions prior to the visit, additional usage of staff PPE, and extensive cleaning of exam room while observing appropriate contact time as indicated for disinfecting solutions.   Problem List Items Addressed This Visit     Welcome to Medicare preventive visit - Primary (Chronic)    I have personally reviewed the Medicare Annual Wellness questionnaire and have noted 1. The patient's medical and social history 2. Their use of alcohol, tobacco or illicit drugs 3. Their current medications and supplements 4. The patient's functional ability including ADL's, fall risks, home safety risks and hearing or visual impairment. Cognitive function has  been assessed and addressed as indicated.  5. Diet and physical activity 6. Evidence for depression or mood disorders The patients weight, height, BMI have been recorded in the chart. I have made referrals, counseling and provided education to the patient based on review of the above and I have provided the pt with a written personalized care plan for preventive services. Provider list updated.. See scanned questionairre as needed for further documentation. Reviewed preventative protocols and updated unless pt declined.       Relevant Orders   EKG 12-Lead (Completed)   Advanced directives, counseling/discussion (Chronic)    Advanced directive discussion - discussed. Has this at home. Wife [name] would be HCPOA. Asked to bring [place] copy       Arthritis    ?gout related joint pain and recent foot swelling as he has h/o this - check uric acid today. Aware of gout preventative diet      Relevant Orders   Uric acid (Completed)   Chronic back pain    In h/o spondylolisthesis. Check baseline lumbar films.       Relevant Orders   DG Lumbar Spine Complete   Smoker    Chronic, 25+ PY hx. Will refer to lung cancer screening program. In smoker, also eligible for  AAA screen - will offer.       Relevant Orders   Ambulatory Referral Lung Cancer Screening [facility]   US AORTA MEDICARE SCREENING   Elevated systolic blood pressure reading without diagnosis of hypertension    BP significantly improved - will continue to monitor.       Microcytic anemia    Longstanding history of microcytosis. Now with new microcytic anemia, will refer to GI for colon cancer screening. Transferrin levels low, normal ferritin levels point against IDA. Check peripheral smear and Hgb electrophoresis - ?hereditary thalassemia.       Relevant Orders   Ambulatory referral to Gastroenterology   Comprehensive metabolic panel (Completed)   Pathologist smear review   Hgb Fractionation Cascade   CBC with  Differential/Platelet (Completed)   Non-recurrent inguinal hernia without obstruction or gangrene    Large, chronic.  Pt hesitant to have surgery due to concern over time out of work. Given size of hernia, I do recommend establishing with gen surg and further discussing surgery - referral placed.       Relevant Orders   Ambulatory referral to General Surgery   PVC (premature ventricular contraction)    EKG with ventricular bigeminy, overall asxs.       Other Visit Diagnoses     Need for vaccination against Streptococcus pneumoniae       Relevant Orders   Pneumococcal conjugate vaccine 20-valent (Completed)   Screening for AAA (abdominal aortic aneurysm)       Relevant Orders   US AORTA MEDICARE SCREENING        No orders of the defined types were placed in this encounter.  Orders Placed This Encounter  Procedures   DG Lumbar Spine Complete    Standing Status:   Future    Standing Expiration Date:   07/25/2022    Order Specific Question:   Reason for Exam (SYMPTOM  OR DIAGNOSIS REQUIRED)    Answer:   chronic lower back pain    Order Specific Question:   Preferred imaging location?    Answer:   [facility]   US AORTA MEDICARE SCREENING    Standing Status:   Future    Standing Expiration Date:   07/26/2022    Order Specific Question:   Reason for Exam (SYMPTOM  OR DIAGNOSIS REQUIRED)    Answer:   AAA screen    Order Specific Question:   Preferred imaging location?    Answer:   [facility]   Pneumococcal conjugate vaccine 20-valent   Comprehensive metabolic panel   Pathologist smear review   Uric acid   Hgb Fractionation Cascade   CBC with Differential/Platelet   Ambulatory referral to Gastroenterology    Referral Priority:   Routine    Referral Type:   Consultation    Referral Reason:   Specialty Services Required    Number of Visits Requested:   1   Ambulatory referral to General Surgery    Referral Priority:   Routine    Referral Type:   Surgical     Referral Reason:   Specialty Services Required    Requested Specialty:   General Surgery    Number of Visits Requested:   1   Ambulatory Referral Lung Cancer Screening [facility]    Referral Priority:   Routine    Referral Type:   Consultation    Referral Reason:   Specialty Services Required    Number of Visits Requested:   1   EKG 12-Lead  Patient instructions; Prevnar-20 today  Repeat labs today  We will refer you to lung cancer screening program.  We will refer you for colon cancer screening.  We will refer you to general surgery for inguinal hernias If interested, check with pharmacy about new 2 shot shingles series (shingrix).  Congratulations on almost quitting smoking!  Bring [place] a copy of your advanced directive/living will.  Return in 6 months for follow up visit.   Follow up plan: Return in about 6 months (around 01/22/2022) for follow up visit.  Eustaquio Boyden, MD

## 2021-07-26 DIAGNOSIS — I498 Other specified cardiac arrhythmias: Secondary | ICD-10-CM | POA: Insufficient documentation

## 2021-07-26 DIAGNOSIS — I493 Ventricular premature depolarization: Secondary | ICD-10-CM | POA: Insufficient documentation

## 2021-07-26 DIAGNOSIS — Z7189 Other specified counseling: Secondary | ICD-10-CM | POA: Insufficient documentation

## 2021-07-26 DIAGNOSIS — Z0000 Encounter for general adult medical examination without abnormal findings: Secondary | ICD-10-CM | POA: Insufficient documentation

## 2021-07-26 DIAGNOSIS — K4090 Unilateral inguinal hernia, without obstruction or gangrene, not specified as recurrent: Secondary | ICD-10-CM | POA: Insufficient documentation

## 2021-07-26 NOTE — Assessment & Plan Note (Deleted)
Preventative protocols reviewed and updated unless pt declined. Discussed healthy diet and lifestyle.  

## 2021-07-26 NOTE — Assessment & Plan Note (Signed)
In h/o spondylolisthesis. Check baseline lumbar films.

## 2021-07-26 NOTE — Assessment & Plan Note (Addendum)
?  gout related joint pain and recent foot swelling as he has h/o this - check uric acid today. Aware of gout preventative diet

## 2021-07-26 NOTE — Assessment & Plan Note (Signed)
Longstanding history of microcytosis. Now with new microcytic anemia, will refer to GI for colon cancer screening. Transferrin levels low, normal ferritin levels point against IDA. Check peripheral smear and Hgb electrophoresis - ?hereditary thalassemia.

## 2021-07-26 NOTE — Assessment & Plan Note (Signed)

## 2021-07-26 NOTE — Assessment & Plan Note (Signed)
BP significantly improved - will continue to monitor.

## 2021-07-26 NOTE — Assessment & Plan Note (Signed)
Large, chronic.  Pt hesitant to have surgery due to concern over time out of work. Given size of hernia, I do recommend establishing with gen surg and further discussing surgery - referral placed.

## 2021-07-26 NOTE — Assessment & Plan Note (Signed)
Advanced directive discussion - discussed. Has this at home. Wife Todd Mcmahon would be HCPOA. Asked to bring us copy  

## 2021-07-26 NOTE — Assessment & Plan Note (Signed)
Chronic, 25+ PY hx. Will refer to lung cancer screening program. In smoker, also eligible for AAA screen - will offer.

## 2021-07-26 NOTE — Assessment & Plan Note (Signed)
EKG with ventricular bigeminy, overall asxs.

## 2021-07-28 LAB — HGB FRACTIONATION CASCADE
Hgb A2: 5.1 % — ABNORMAL HIGH (ref 1.8–3.2)
Hgb A: 94.3 % — ABNORMAL LOW (ref 96.4–98.8)
Hgb F: 0.6 % (ref 0.0–2.0)
Hgb S: 0 %

## 2021-07-29 ENCOUNTER — Encounter: Payer: Self-pay | Admitting: Family Medicine

## 2021-07-29 LAB — CBC WITH DIFFERENTIAL/PLATELET
Absolute Monocytes: 688 cells/uL (ref 200–950)
Basophils Absolute: 40 cells/uL (ref 0–200)
Basophils Relative: 0.5 %
Eosinophils Absolute: 88 cells/uL (ref 15–500)
Eosinophils Relative: 1.1 %
HCT: 44.9 % (ref 38.5–50.0)
Hemoglobin: 13 g/dL — ABNORMAL LOW (ref 13.2–17.1)
Lymphs Abs: 2768 cells/uL (ref 850–3900)
MCH: 21.2 pg — ABNORMAL LOW (ref 27.0–33.0)
MCHC: 29 g/dL — ABNORMAL LOW (ref 32.0–36.0)
MCV: 73.4 fL — ABNORMAL LOW (ref 80.0–100.0)
MPV: 9.8 fL (ref 7.5–12.5)
Monocytes Relative: 8.6 %
Neutro Abs: 4416 cells/uL (ref 1500–7800)
Neutrophils Relative %: 55.2 %
Platelets: 511 10*3/uL — ABNORMAL HIGH (ref 140–400)
RBC: 6.12 10*6/uL — ABNORMAL HIGH (ref 4.20–5.80)
RDW: 14.8 % (ref 11.0–15.0)
Total Lymphocyte: 34.6 %
WBC: 8 10*3/uL (ref 3.8–10.8)

## 2021-07-29 LAB — PATHOLOGIST SMEAR REVIEW

## 2021-08-01 ENCOUNTER — Ambulatory Visit
Admission: RE | Admit: 2021-08-01 | Discharge: 2021-08-01 | Disposition: A | Discharge status: Home / Self Care | Payer: Medicare HMO | Source: Ambulatory Visit | Attending: Family Medicine | Admitting: Family Medicine

## 2021-08-01 DIAGNOSIS — F17200 Nicotine dependence, unspecified, uncomplicated: Secondary | ICD-10-CM

## 2021-08-01 DIAGNOSIS — Z136 Encounter for screening for cardiovascular disorders: Secondary | ICD-10-CM | POA: Diagnosis not present

## 2021-08-04 ENCOUNTER — Telehealth: Payer: Self-pay | Admitting: Family Medicine

## 2021-08-04 DIAGNOSIS — I719 Aortic aneurysm of unspecified site, without rupture: Secondary | ICD-10-CM

## 2021-08-04 NOTE — Telephone Encounter (Signed)
Radiology called about Todd Mcmahon's Aorta Screening. I verified it was in his chart.

## 2021-08-04 NOTE — Telephone Encounter (Signed)
Noted. I've been trying to reach patient this morning, will continue trying.

## 2021-08-04 NOTE — Telephone Encounter (Signed)
Spoke with patient. See result note.  

## 2021-08-15 ENCOUNTER — Ambulatory Visit: Payer: Medicare HMO

## 2021-08-22 ENCOUNTER — Other Ambulatory Visit: Payer: Self-pay

## 2021-08-22 ENCOUNTER — Ambulatory Visit
Admission: RE | Admit: 2021-08-22 | Discharge: 2021-08-22 | Disposition: A | Discharge status: Home / Self Care | Payer: Medicare HMO | Source: Ambulatory Visit | Attending: Family Medicine | Admitting: Family Medicine

## 2021-08-22 DIAGNOSIS — M4317 Spondylolisthesis, lumbosacral region: Secondary | ICD-10-CM | POA: Diagnosis not present

## 2021-08-22 DIAGNOSIS — K4020 Bilateral inguinal hernia, without obstruction or gangrene, not specified as recurrent: Secondary | ICD-10-CM | POA: Diagnosis not present

## 2021-08-22 DIAGNOSIS — I719 Aortic aneurysm of unspecified site, without rupture: Secondary | ICD-10-CM

## 2021-08-22 DIAGNOSIS — I714 Abdominal aortic aneurysm, without rupture: Secondary | ICD-10-CM | POA: Diagnosis not present

## 2021-08-22 DIAGNOSIS — K769 Liver disease, unspecified: Secondary | ICD-10-CM | POA: Diagnosis not present

## 2021-08-22 MED ORDER — IOPAMIDOL (ISOVUE-370) INJECTION 76%
75.0000 mL | Freq: Once | INTRAVENOUS | Status: AC | PRN
  Administered 2021-08-22: 75 mL via INTRAVENOUS

## 2021-08-26 ENCOUNTER — Encounter: Payer: Medicare HMO | Admitting: Vascular Surgery

## 2021-08-29 ENCOUNTER — Ambulatory Visit (INDEPENDENT_AMBULATORY_CARE_PROVIDER_SITE_OTHER): Payer: Medicare HMO | Admitting: Vascular Surgery

## 2021-08-29 ENCOUNTER — Encounter: Payer: Self-pay | Admitting: Vascular Surgery

## 2021-08-29 ENCOUNTER — Other Ambulatory Visit: Payer: Self-pay

## 2021-08-29 VITALS — BP 176/93 | HR 64 | Temp 98.5°F | Resp 20 | Ht 68.0 in | Wt 174.9 lb

## 2021-08-29 DIAGNOSIS — I719 Aortic aneurysm of unspecified site, without rupture: Secondary | ICD-10-CM | POA: Diagnosis not present

## 2021-08-29 NOTE — Progress Notes (Signed)
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 (1956-02-08) male presenting at the request of .Eustaquio Boyden, MD after undergoing surveillance AAA ultrasound which demonstrated concern for small aneurysm.  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   On exam, [place] was doing well.  He has a history of spondylolisthesis and bilateral hernias. 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  Spondylolisthesis has been a problem for [employer] for several years.  He has managed this through stretching and antigravity routines.  [facility] was unaware of any aortic pathology prior to undergoing his ultrasound and CT.  He has no family history.  He denies abdominal pain but has had a longstanding history of back pain, severity of which has been unchanged over the last several years  The pt is not on a statin for cholesterol management.  The pt is not on a daily aspirin.   Other AC:   The pt is not on medication for hypertension.   The pt is not diabetic.  Tobacco hx:  current smoker  Past Medical History:  Diagnosis Date   Arthritis    Chronic back pain    COVID-19 virus infection 03/2021   Smoker 1972    History reviewed. No pertinent surgical history.  Social History   Socioeconomic History   Marital status: Married    Spouse name: Not on file   Number of children: Not on file   Years of education: Not on file   Highest education level: Not on file  Occupational History   Not on file  Tobacco Use   Smoking status: Some Days    Packs/day: 0.25    Years: 50.00    Pack years: 12.50    Types: Cigarettes   Smokeless tobacco: Never  Vaping Use   Vaping Use: Never used  Substance and Sexual Activity   Alcohol use: Yes   Drug use: Never   Sexual activity: Not on file   Other Topics Concern   Not on file  Social History Narrative   Lives with wife [name]   Occ: Builder   Edu: HS    Activity: stays active at work   Diet: good water, fruits/vegetables daily   Social Determinants of Corporate investment banker Strain: Not on file  Food Insecurity: Not on file  Transportation Needs: Not on file  Physical Activity: Not on file  Stress: Not on file  Social Connections: Not on file  Intimate Partner Violence: Not on file    Family History  Problem Relation Age of Onset   Cancer Neg Hx    CAD Neg Hx    Stroke Neg Hx    Diabetes Neg Hx     No current outpatient medications on file.   No current facility-administered medications for this visit.    No Known Allergies   REVIEW OF SYSTEMS:   [X]  denotes positive finding, [ ]  denotes negative finding Cardiac  Comments:  Chest pain or chest pressure:    Shortness of breath upon exertion:    Short of breath when lying flat:    Irregular heart rhythm:        Vascular    Pain in calf, thigh, or hip brought on by ambulation:    Pain in feet at night that wakes you up  from your sleep:     Blood clot in your veins:    Leg swelling:         Pulmonary    Oxygen at home:    Productive cough:     Wheezing:         Neurologic    Sudden weakness in arms or legs:     Sudden numbness in arms or legs:     Sudden onset of difficulty speaking or slurred speech:    Temporary loss of vision in one eye:     Problems with dizziness:         Gastrointestinal    Blood in stool:     Vomited blood:         Genitourinary    Burning when urinating:     Blood in urine:        Psychiatric    Major depression:         Hematologic    Bleeding problems:    Problems with blood clotting too easily:        Skin    Rashes or ulcers:        Constitutional    Fever or chills:      PHYSICAL EXAMINATION:  Vitals:   08/29/21 1125  BP: (!) 176/93  Pulse: 64  Resp: 20  Temp: 98.5 F (36.9 C)   SpO2: 96%  Weight: 174 lb 14.4 oz (79.3 kg)  Height: 5\' 8"  (1.727 m)    General:  WDWN in NAD; vital signs documented above Gait: Not observed HENT: WNL, normocephalic Pulmonary: normal, CTAB Cardiac: regular HR,  Abdomen: soft, NT, no masses, large bilateral inguinal hernias Skin: without rashes Vascular Exam/Pulses:  Right Left  Radial 2+ (normal) 2+ (normal)  Ulnar 2+ (normal) 2+ (normal)          DP 2+ (normal) 2+ (normal)  PT 2+ (normal) 2+ (normal)   Extremities: without ischemic changes, without Gangrene , without cellulitis; without open wounds;  Musculoskeletal: no muscle wasting or atrophy  Neurologic: A&O X 3;  No focal weakness or paresthesias are detected Psychiatric:  The pt has Normal affect.   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  No clinical signs concerning for rupture, no fat stranding, no wall thickening.    ASSESSMENT/PLAN:: 65 y.o. 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  He has no history of trauma to that region.  He has no new onset abdominal pain.  Chronic back pain from spondylolisthesis.  In the PACU is measuring 1 cm with the entirety of the aorta measuring less than 3cm I will continue to follow this yearly with abdominal ultrasounds.   If this were to increase in size >5.5cm or >1cm in the next year, I will discuss the role of surgery.   Currently we will continue medical management. Patient would benefit from high intensity statin, lifelong aspirin for cardiovascular risk factors. Although the risk of rupture is very low, we discussed the signs and symptoms of aortic rupture and I implored him to seek immediate medical attention should new onset back pain, abdominal pain occur.    75, MD [facility] [phone]

## 2021-09-11 ENCOUNTER — Other Ambulatory Visit: Payer: Self-pay | Admitting: *Deleted

## 2021-09-11 DIAGNOSIS — Z87891 Personal history of nicotine dependence: Secondary | ICD-10-CM

## 2021-09-22 ENCOUNTER — Encounter: Payer: Self-pay | Admitting: Acute Care

## 2021-09-22 ENCOUNTER — Telehealth (INDEPENDENT_AMBULATORY_CARE_PROVIDER_SITE_OTHER): Payer: Medicare HMO | Admitting: Acute Care

## 2021-09-22 DIAGNOSIS — F17210 Nicotine dependence, cigarettes, uncomplicated: Secondary | ICD-10-CM

## 2021-09-22 NOTE — Progress Notes (Signed)
Virtual Visit via Video Note  I connected with Todd Mcmahon on 09/22/21 at  2:30 PM EDT by a video enabled telemedicine application and verified that I am speaking with the correct person using two identifiers.  Location: Patient: At home Provider:  [address], [place], [place], [address]     I discussed the limitations of evaluation and management by telemedicine and the availability of in person appointments. The patient expressed understanding and agreed to proceed.  Shared Decision Making Visit Lung Cancer Screening Program [phone])   Eligibility: Age 65 y.o. Pack Years Smoking History Calculation 35 pack year smoking history (# packs/per year x # years smoked) Recent History of coughing up blood  no Unexplained weight loss? no ( >Than 15 pounds within the last 6 months ) Prior History Lung / other cancer no (Diagnosis within the last 5 years already requiring surveillance chest CT Scans). Smoking Status Current Smoker Former Smokers: Years since quit: NA  Quit Date: NA  Visit Components: Discussion included one or more decision making aids. yes Discussion included risk/benefits of screening. yes Discussion included potential follow up diagnostic testing for abnormal scans. yes Discussion included meaning and risk of over diagnosis. yes Discussion included meaning and risk of False Positives. yes Discussion included meaning of total radiation exposure. yes  Counseling Included: Importance of adherence to annual lung cancer LDCT screening. yes Impact of comorbidities on ability to participate in the program. yes Ability and willingness to under diagnostic treatment. yes  Smoking Cessation Counseling: Current Smokers:  Discussed importance of smoking cessation. yes Information about tobacco cessation classes and interventions provided to patient. yes Patient provided with "ticket" for LDCT Scan. yes Symptomatic Patient. no  Counseling NA Diagnosis Code:  Tobacco Use Z72.0 Asymptomatic Patient yes  Counseling (Intermediate counseling: > three minutes counseling) K4628 Former Smokers:  Discussed the importance of maintaining cigarette abstinence. yes Diagnosis Code: Personal History of Nicotine Dependence. M38.177 Information about tobacco cessation classes and interventions provided to patient. Yes Patient provided with "ticket" for LDCT Scan. yes Written Order for Lung Cancer Screening with LDCT placed in Epic. Yes (CT Chest Lung Cancer Screening Low Dose W/O CM) [id number] Z12.2-Screening of respiratory organs Z87.891-Personal history of nicotine dependence  I have spent 25 minutes of face to face time with  Todd Mcmahon discussing the risks and benefits of lung cancer screening. We viewed a power point together that explained in detail the above noted topics. We paused at intervals to allow for questions to be asked and answered to ensure understanding.We discussed that the single most powerful action that he can take to decrease his risk of developing lung cancer is to quit smoking. We discussed whether or not he is ready to commit to setting a quit date. We discussed options for tools to aid in quitting smoking including nicotine replacement therapy, non-nicotine medications, support groups, Quit Smart classes, and behavior modification. We discussed that often times setting smaller, more achievable goals, such as eliminating 1 cigarette a day for a week and then 2 cigarettes a day for a week can be helpful in slowly decreasing the number of cigarettes smoked. This allows for a sense of accomplishment as well as providing a clinical benefit. I gave him the " Be Stronger Than Your Excuses" card with contact information for community resources, classes, free nicotine replacement therapy, and access to mobile apps, text messaging, and on-line smoking cessation help. I have also given him my card and contact information in the event  he needs to  contact me. We discussed the time and location of the scan, and that either Abigail Miyamoto RN or I will call with the results within 24-48 hours of receiving them. I have offered him  a copy of the power point we viewed  as a resource in the event they need reinforcement of the concepts we discussed today in the office. The patient verbalized understanding of all of  the above and had no further questions upon leaving the office. They have my contact information in the event they have any further questions.  I spent 3 minutes counseling on smoking cessation and the health risks of continued tobacco abuse.  I explained to the patient that there has been a high incidence of coronary artery disease noted on these exams. I explained that this is a non-gated exam therefore degree or severity cannot be determined. This patient is not on statin therapy. I have asked the patient to follow-up with their PCP regarding any incidental finding of coronary artery disease and management with diet or medication as their PCP  feels is clinically indicated. The patient verbalized understanding of the above and had no further questions upon completion of the visit.      Bevelyn Ngo, NP 09/22/2021

## 2021-09-22 NOTE — Patient Instructions (Signed)
Thank you for participating in the [facility]. It was our pleasure to meet you today. We will call you with the results of your scan within the next few days. Your scan will be assigned a Lung RADS category score by the physicians reading the scans.  This Lung RADS score determines follow up scanning.  See below for description of categories, and follow up screening recommendations. We will be in touch to schedule your follow up screening annually or based on recommendations of our providers. We will fax a copy of your scan results to your Primary Care Physician, or the physician who referred you to the program, to ensure they have the results. Please call the office if you have any questions or concerns regarding your scanning experience or results.  Our office number is [phone]. Please speak with Abigail Miyamoto, RN. She is our Lung Cancer Data processing manager. If she is unavailable when you call, please have the office staff send her a message. She will return your call at her earliest convenience. Remember, if your scan is normal, we will scan you annually as long as you continue to meet the criteria for the program. (Age 25-77, Current smoker or smoker who has quit within the last 15 years). If you are a smoker, remember, quitting is the single most powerful action that you can take to decrease your risk of lung cancer and other pulmonary, breathing related problems. We know quitting is hard, and we are here to help.  Please let us know if there is anything we can do to help you meet your goal of quitting. If you are a former smoker, Counselling psychologist. We are proud of you! Remain smoke free! Remember you can refer friends or family members through the number above.  We will screen them to make sure they meet criteria for the program. Thank you for helping [place] take better care of you by participating in Lung Screening.  Lung RADS Categories:  Lung RADS 1: no nodules  or definitely non-concerning nodules.  Recommendation is for a repeat annual scan in 12 months.  Lung RADS 2:  nodules that are non-concerning in appearance and behavior with a very low likelihood of becoming an active cancer. Recommendation is for a repeat annual scan in 12 months.  Lung RADS 3: nodules that are probably non-concerning , includes nodules with a low likelihood of becoming an active cancer.  Recommendation is for a 69-month repeat screening scan. Often noted after an upper respiratory illness. We will be in touch to make sure you have no questions, and to schedule your 32-month scan.  Lung RADS 4 A: nodules with concerning findings, recommendation is most often for a follow up scan in 3 months or additional testing based on our provider's assessment of the scan. We will be in touch to make sure you have no questions and to schedule the recommended 3 month follow up scan.  Lung RADS 4 B:  indicates findings that are concerning. We will be in touch with you to schedule additional diagnostic testing based on our provider's  assessment of the scan.  Call 1-800 QUIT NOW for free nicotine patches gum or mints to help you quit smoking.

## 2021-09-23 ENCOUNTER — Other Ambulatory Visit: Payer: Self-pay

## 2021-09-23 ENCOUNTER — Ambulatory Visit
Admission: RE | Admit: 2021-09-23 | Discharge: 2021-09-23 | Disposition: A | Discharge status: Home / Self Care | Payer: Medicare HMO | Source: Ambulatory Visit | Attending: Acute Care | Admitting: Acute Care

## 2021-09-23 DIAGNOSIS — Z87891 Personal history of nicotine dependence: Secondary | ICD-10-CM | POA: Insufficient documentation

## 2021-09-23 DIAGNOSIS — F17210 Nicotine dependence, cigarettes, uncomplicated: Secondary | ICD-10-CM | POA: Diagnosis not present

## 2021-09-28 ENCOUNTER — Encounter: Payer: Self-pay | Admitting: Family Medicine

## 2021-09-28 DIAGNOSIS — R911 Solitary pulmonary nodule: Secondary | ICD-10-CM | POA: Insufficient documentation

## 2021-09-28 DIAGNOSIS — I7121 Aneurysm of the ascending aorta, without rupture: Secondary | ICD-10-CM | POA: Insufficient documentation

## 2021-09-28 DIAGNOSIS — I2510 Atherosclerotic heart disease of native coronary artery without angina pectoris: Secondary | ICD-10-CM | POA: Insufficient documentation

## 2021-09-28 DIAGNOSIS — I700 Atherosclerosis of aorta: Secondary | ICD-10-CM | POA: Insufficient documentation

## 2021-10-06 ENCOUNTER — Telehealth: Payer: Self-pay | Admitting: Acute Care

## 2021-10-06 DIAGNOSIS — R911 Solitary pulmonary nodule: Secondary | ICD-10-CM

## 2021-10-06 NOTE — Telephone Encounter (Signed)
Contacted patient by phone re: LDCT chest results.  Explained to patient of recommendation for follow up LDCT in 6 months due to lung nodule finding.  Nodule is not concerning for cancer at this time but due to new finding, it is advised to repeat CT in 6 months. Will contact patient closer to CT due date to get scheduled.  Patient verbalized understanding and had no further questions.  Will place order for follow up CT 6 months.

## 2021-10-08 ENCOUNTER — Encounter: Payer: Self-pay | Admitting: Gastroenterology

## 2021-10-28 ENCOUNTER — Other Ambulatory Visit: Payer: Medicare HMO

## 2021-10-28 ENCOUNTER — Encounter: Payer: Self-pay | Admitting: Gastroenterology

## 2021-10-28 ENCOUNTER — Ambulatory Visit (INDEPENDENT_AMBULATORY_CARE_PROVIDER_SITE_OTHER): Payer: Medicare HMO | Admitting: Gastroenterology

## 2021-10-28 VITALS — BP 164/82 | HR 40 | Ht 67.5 in | Wt 175.1 lb

## 2021-10-28 DIAGNOSIS — D569 Thalassemia, unspecified: Secondary | ICD-10-CM | POA: Diagnosis not present

## 2021-10-28 DIAGNOSIS — Z1211 Encounter for screening for malignant neoplasm of colon: Secondary | ICD-10-CM

## 2021-10-28 DIAGNOSIS — R718 Other abnormality of red blood cells: Secondary | ICD-10-CM | POA: Diagnosis not present

## 2021-10-28 DIAGNOSIS — K4020 Bilateral inguinal hernia, without obstruction or gangrene, not specified as recurrent: Secondary | ICD-10-CM

## 2021-10-28 DIAGNOSIS — Z1212 Encounter for screening for malignant neoplasm of rectum: Secondary | ICD-10-CM

## 2021-10-28 DIAGNOSIS — D563 Thalassemia minor: Secondary | ICD-10-CM | POA: Diagnosis not present

## 2021-10-28 NOTE — Patient Instructions (Addendum)
If you are age 66 or older, your body mass index should be between 23-30. Your Body mass index is 27.02 kg/m. If this is out of the aforementioned range listed, please consider follow up with your Primary Care Provider.  If you are age 54 or younger, your body mass index should be between 19-25. Your Body mass index is 27.02 kg/m. If this is out of the aformentioned range listed, please consider follow up with your Primary Care Provider.   Your provider has ordered Cologuard testing as an option for colon cancer screening. This is performed by [employer] and may be out of network with your insurance. PRIOR to completing the test, it is YOUR responsibility to contact your insurance about covered benefits for this test. Your out of pocket expense could be anywhere from $0.00 to $649.00.   When you call to check coverage with your insurer, please provide the following information:   -The ONLY provider of Cologuard is Optician, dispensing  - CPT code for Cologuard is [id number].  [facility] NPI # [id number]  -[employer] Tax ID # [id number]   We have already sent your demographic and insurance information to [employer] (phone number [phone]) and they should contact you within the next week regarding your test. If you have not heard from them within the next week, please call our office at [phone].    Your provider has requested that you go to the basement level for lab work before leaving today. Press "B" on the elevator. The lab is located at the first door on the left as you exit the elevator.  The  GI providers would like to encourage you to use [facility] to communicate with providers for non-urgent requests or questions.  Due to long hold times on the telephone, sending your provider a message by [facility] may be a faster and more efficient way to get a response.  Please allow 48 business hours for a response.  Please remember that this is  for non-urgent requests.   It was a pleasure to see you today!  Thank you for trusting me with your gastrointestinal care!    Scott E.Tomasa Rand, MD

## 2021-10-28 NOTE — Progress Notes (Signed)
HPI : Todd Mcmahon is a very pleasant 65 year old male with chronic microcytosis referred to [place] for initial average risk screening colonoscopy.  He has a long standing microcytosis (MCV 70s) and has borderline low hgb (13).  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  Recent iron panel was equivocal for iron deficiency with low serum iron and iron sat, but normal ferritin and TIBC.  He has no family history of GI malignancy.  He denies any chronic GI symptoms to include abdominal pain, constipation, diarrhea or blood in the stool.  He denies any chronic upper GI symptoms such as frequent heartburn/acid regurgitation, dysphagia, nausea/vomiting, early satiety.  No unintentional weight loss.  He denies any symptoms of iron deficiency/anemia such as fatigue, shortness of breath, restless leg, pica. He has never had a colonoscopy before. He has a massive R inguinal hernia which he has never sought surgical repair due to lack of insurance as well as concern of time being out of work.  Most recent CT showed that that the hernia involved the large intestine as well as small intestine.  Past Medical History:  Diagnosis Date   Arthritis    Chronic back pain    COVID-19 virus infection 03/2021   HTN (hypertension)    Smoker 1972     Past Surgical History:  Procedure Laterality Date   NO PAST SURGERIES     Family History  Problem Relation Age of Onset   Diverticulitis Son    Panic disorder Daughter    Cancer Neg Hx    CAD Neg Hx    Stroke Neg Hx    Diabetes Neg Hx    Social History   Tobacco Use   Smoking status: Some Days    Packs/day: 0.25    Years: 50.00    Pack years: 12.50    Types: Cigarettes   Smokeless tobacco: Never  Vaping Use   Vaping Use: Never used  Substance Use Topics   Alcohol use: Yes    Comment: 3-4 beers per day   Drug use: Never   No current outpatient medications on file.   No current facility-administered medications for this visit.    No Known Allergies   Review of Systems: All systems reviewed and negative except where noted in HPI.    No results found.  Physical Exam: BP (!) 164/82 (BP Location: Left Arm, Patient Position: Sitting, Cuff Size: Normal)   Pulse (!) 40 Comment: irregular  Ht 5' 7.5" (1.715 m)   Wt 175 lb 2 oz (79.4 kg)   BMI 27.02 kg/m  Constitutional: Pleasant,well-developed, male in no acute distress. HEENT: Normocephalic and atraumatic. Conjunctivae are normal. No scleral icterus. Neck supple.  Cardiovascular: Normal rate, regular rhythm.  Pulmonary/chest: Effort normal and breath sounds normal. No wheezing, rales or rhonchi. Abdominal: Soft, nondistended, nontender. Bowel sounds active throughout. There are no masses palpable. No hepatomegaly.  Hernia not examined, but was visibly apparent through clothing as a large bulge Extremities: no edema Neurological: Alert and oriented to person place and time. Skin: Skin is warm and dry. No rashes noted. Psychiatric: Normal mood and affect. Behavior is normal.  CBC    Component Value Date/Time   WBC 8.0 07/25/2021 1004   RBC 6.12 (H) 07/25/2021 1004   HGB 13.0 (L) 07/25/2021 1004   HGB 12.9 (L) 06/08/2014 0151   HCT 44.9 07/25/2021 1004   HCT 42.2 06/08/2014 0151   PLT 511 (H) 07/25/2021 1004   PLT  205 06/08/2014 0151   MCV 73.4 (L) 07/25/2021 1004   MCV 69 (L) 06/08/2014 0151   MCH 21.2 (L) 07/25/2021 1004   MCHC 29.0 (L) 07/25/2021 1004   RDW 14.8 07/25/2021 1004   RDW 15.7 (H) 06/08/2014 0151   LYMPHSABS 2,768 07/25/2021 1004   LYMPHSABS 3.3 06/08/2014 0151   MONOABS 0.9 07/18/2021 0808   MONOABS 0.9 06/08/2014 0151   EOSABS 88 07/25/2021 1004   EOSABS 0.1 06/08/2014 0151   BASOSABS 40 07/25/2021 1004   BASOSABS 0.1 06/08/2014 0151    CMP     Component Value Date/Time   NA 137 07/25/2021 1004   NA 135 (L) 06/08/2014 0151   K 4.1 07/25/2021 1004   K 3.7 06/08/2014 0151   CL 102 07/25/2021 1004   CL 105 06/08/2014 0151    CO2 28 07/25/2021 1004   CO2 24 06/08/2014 0151   GLUCOSE 102 (H) 07/25/2021 1004   GLUCOSE 106 (H) 06/08/2014 0151   BUN 11 07/25/2021 1004   BUN 9 06/08/2014 0151   CREATININE 0.85 07/25/2021 1004   CREATININE 0.95 06/08/2014 0151   CALCIUM 9.1 07/25/2021 1004   CALCIUM 7.8 (L) 06/08/2014 0151   PROT 6.9 07/25/2021 1004   PROT 6.1 (L) 06/08/2014 0151   ALBUMIN 3.8 07/25/2021 1004   ALBUMIN 3.1 (L) 06/08/2014 0151   AST 14 07/25/2021 1004   AST 17 06/08/2014 0151   ALT 13 07/25/2021 1004   ALT 19 06/08/2014 0151   ALKPHOS 49 07/25/2021 1004   ALKPHOS 48 06/08/2014 0151   BILITOT 0.6 07/25/2021 1004   BILITOT 0.9 06/08/2014 0151   GFRNONAA >60 06/08/2014 0151   GFRAA >60 06/08/2014 0151    Latest Reference Range & Units 07/18/21 08:08  Iron 42 - 165 ug/dL 33 (L)  TIBC 841.3 - 244.0 mcg/dL 102.7  Saturation Ratios 20.0 - 50.0 % 11.7 (L)  Ferritin 22.0 - 322.0 ng/mL 204.9  Transferrin 212.0 - 360.0 mg/dL 253.6 (L)  (L): Data is abnormally low  Hgb F 0.0 - 2.0 % 0.6   Hgb A 96.4 - 98.8 % 94.3 Low    Hgb A2 1.8 - 3.2 % 5.1 High    Hgb S 0.0 % 0.0    Comment: Hemoglobin pattern and concentrations are consistent with beta-  Thalassemia minor. Suggest hematologic and clinical correlation  ASSESSMENT AND PLAN: 65 year old male referred for initial screening colonoscopy.  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  Iron indices were equivocal for iron deficiency and I do not think are playing a role in his microcytosis or borderline low hemoglobin.  Although a colonoscopy is indicated, his very large inguinal hernia appears to contain a loop of large intestine.  For this reason, I am very hesitant attempt colonoscopy due to anticipated difficulty and increased risk.  I recommended that we proceed with a stool based screening test for now.  If positive, I would recommend he have his hernia repaired prior to a  diagnostic colonoscopy. Given equivocal iron indices, will screen for H. pylori and celiac disease  Colon cancer screening - Favor stool-based test for now, given anticipated difficulty and risk with colonoscopy due to large inguinal hernia containing large bowel - Cologuard  Microcytic anemia, with borderline iron indices and confirmed beta thalassemia minor -Screen for H. pylori and celiac disease  Large inguinal hernia -Strongly recommended patient pursue surgical treatment as soon as possible  Herman Fiero E. Tomasa Rand,  MD [facility]   Eustaquio Boyden, MD

## 2021-10-29 LAB — IGA: Immunoglobulin A: 139 mg/dL (ref 70–320)

## 2021-10-29 LAB — TISSUE TRANSGLUTAMINASE, IGA: (tTG) Ab, IgA: <1 U/mL

## 2021-10-31 ENCOUNTER — Other Ambulatory Visit: Payer: Medicare HMO

## 2021-10-31 DIAGNOSIS — R718 Other abnormality of red blood cells: Secondary | ICD-10-CM

## 2021-10-31 DIAGNOSIS — Z1212 Encounter for screening for malignant neoplasm of rectum: Secondary | ICD-10-CM | POA: Diagnosis not present

## 2021-10-31 DIAGNOSIS — Z1211 Encounter for screening for malignant neoplasm of colon: Secondary | ICD-10-CM

## 2021-10-31 DIAGNOSIS — D563 Thalassemia minor: Secondary | ICD-10-CM

## 2021-11-02 LAB — H. PYLORI ANTIGEN, STOOL: H pylori Ag, Stl: NEGATIVE

## 2021-11-03 DIAGNOSIS — H5203 Hypermetropia, bilateral: Secondary | ICD-10-CM | POA: Diagnosis not present

## 2021-11-03 DIAGNOSIS — Z0100 Encounter for examination of eyes and vision without abnormal findings: Secondary | ICD-10-CM | POA: Diagnosis not present

## 2021-11-05 NOTE — Progress Notes (Signed)
Todd Mcmahon,  Please note that your test for celiac disease was negative.

## 2021-11-05 NOTE — Progress Notes (Signed)
Todd Mcmahon,  Please note that your H. Pylori test was also negative.

## 2021-11-07 DIAGNOSIS — Z1211 Encounter for screening for malignant neoplasm of colon: Secondary | ICD-10-CM | POA: Diagnosis not present

## 2021-11-07 DIAGNOSIS — Z1212 Encounter for screening for malignant neoplasm of rectum: Secondary | ICD-10-CM | POA: Diagnosis not present

## 2021-11-12 LAB — COLOGUARD: COLOGUARD: NEGATIVE

## 2021-11-20 NOTE — Progress Notes (Signed)
Todd Mcmahon,  Your Cologuard test was negative.  You will be due for colon cancer screening again in 2 years.

## 2021-11-24 ENCOUNTER — Encounter: Payer: Self-pay | Admitting: Family Medicine

## 2021-11-24 DIAGNOSIS — R160 Hepatomegaly, not elsewhere classified: Secondary | ICD-10-CM | POA: Insufficient documentation

## 2021-11-25 DIAGNOSIS — Z0100 Encounter for examination of eyes and vision without abnormal findings: Secondary | ICD-10-CM | POA: Diagnosis not present

## 2021-11-27 ENCOUNTER — Telehealth: Payer: Self-pay | Admitting: Family Medicine

## 2021-11-27 DIAGNOSIS — R590 Localized enlarged lymph nodes: Secondary | ICD-10-CM

## 2021-11-27 NOTE — Telephone Encounter (Signed)
Plz notify I've ordered pelvic CT scan to monitor enlarged lymph node noted previously.

## 2021-11-27 NOTE — Telephone Encounter (Signed)
-----   Message from Eustaquio Boyden, MD sent at 08/25/2021  8:07 AM EDT ----- Rpt pelvic CT in 3 months

## 2021-11-28 NOTE — Telephone Encounter (Signed)
Patient notified as instructed by telephone and verbalized understanding. 

## 2021-12-02 ENCOUNTER — Ambulatory Visit
Admission: RE | Admit: 2021-12-02 | Discharge: 2021-12-02 | Disposition: A | Discharge status: Home / Self Care | Payer: Medicare HMO | Source: Ambulatory Visit | Attending: Family Medicine | Admitting: Family Medicine

## 2021-12-02 DIAGNOSIS — R590 Localized enlarged lymph nodes: Secondary | ICD-10-CM

## 2021-12-02 DIAGNOSIS — K4090 Unilateral inguinal hernia, without obstruction or gangrene, not specified as recurrent: Secondary | ICD-10-CM | POA: Diagnosis not present

## 2021-12-02 MED ORDER — IOPAMIDOL (ISOVUE-300) INJECTION 61%
100.0000 mL | Freq: Once | INTRAVENOUS | Status: AC | PRN
  Administered 2021-12-02: 100 mL via INTRAVENOUS

## 2021-12-07 ENCOUNTER — Encounter: Payer: Self-pay | Admitting: Family Medicine

## 2021-12-22 ENCOUNTER — Other Ambulatory Visit: Payer: Medicare HMO

## 2022-01-23 ENCOUNTER — Other Ambulatory Visit: Payer: Self-pay

## 2022-01-23 ENCOUNTER — Ambulatory Visit (INDEPENDENT_AMBULATORY_CARE_PROVIDER_SITE_OTHER): Payer: Medicare HMO | Admitting: Family Medicine

## 2022-01-23 ENCOUNTER — Encounter: Payer: Self-pay | Admitting: Family Medicine

## 2022-01-23 VITALS — BP 148/78 | HR 42 | Temp 97.5°F | Ht 67.5 in | Wt 179.2 lb

## 2022-01-23 DIAGNOSIS — I10 Essential (primary) hypertension: Secondary | ICD-10-CM

## 2022-01-23 DIAGNOSIS — I498 Other specified cardiac arrhythmias: Secondary | ICD-10-CM

## 2022-01-23 DIAGNOSIS — K4090 Unilateral inguinal hernia, without obstruction or gangrene, not specified as recurrent: Secondary | ICD-10-CM | POA: Diagnosis not present

## 2022-01-23 DIAGNOSIS — I700 Atherosclerosis of aorta: Secondary | ICD-10-CM | POA: Diagnosis not present

## 2022-01-23 DIAGNOSIS — D563 Thalassemia minor: Secondary | ICD-10-CM | POA: Diagnosis not present

## 2022-01-23 DIAGNOSIS — I7121 Aneurysm of the ascending aorta, without rupture: Secondary | ICD-10-CM

## 2022-01-23 DIAGNOSIS — R911 Solitary pulmonary nodule: Secondary | ICD-10-CM

## 2022-01-23 DIAGNOSIS — I2510 Atherosclerotic heart disease of native coronary artery without angina pectoris: Secondary | ICD-10-CM

## 2022-01-23 DIAGNOSIS — R590 Localized enlarged lymph nodes: Secondary | ICD-10-CM | POA: Diagnosis not present

## 2022-01-23 DIAGNOSIS — I719 Aortic aneurysm of unspecified site, without rupture: Secondary | ICD-10-CM | POA: Diagnosis not present

## 2022-01-23 MED ORDER — AMLODIPINE BESYLATE 5 MG PO TABS: 5.0000 mg | ORAL_TABLET | Freq: Every day | ORAL | 3 refills | Status: DC

## 2022-01-23 NOTE — Patient Instructions (Addendum)
Let us know when ready for general surgery evaluation for referral.  ?Start amlodipine 5mg  daily.  ?Continue monitoring blood pressures let know how they're running.  ?Goal blood pressure is <140/90, ideally I want yours closer to 120-130 on top.  ?Return in 6 weeks for blood pressure check.  ?Good to see you today.  ?

## 2022-01-23 NOTE — Assessment & Plan Note (Addendum)
Appreciate [facility] care. Planning yearly monitoring.  ?Discussed importance of better BP control in h/o this.  ?

## 2022-01-23 NOTE — Assessment & Plan Note (Signed)
Discussed new diagnosis.  ?

## 2022-01-23 NOTE — Assessment & Plan Note (Signed)
Bilateral, R>L, imaging with evidence of R hernia containing large intestine. Did recommend gen surgery referral. He declines due to financial concerns. Reviewed possible complications of untreated large hernia as well as reasons to seek ER care. He will let me know when ready for gen surgery referral.  ?

## 2022-01-23 NOTE — Assessment & Plan Note (Signed)
Lung cancer screening CT returned showing CAC, aort ATH, ascending aortic aneurysm measuring 4.1cm, and 6.68mm L lingular pulm nodule rec rpt T 6 months (CT chest LCS nodule f/u without CM).  ?

## 2022-01-23 NOTE — Assessment & Plan Note (Signed)
BP remains elevated as well as home readings.  ?Will start amlodipine 5mg  daily, monitoring for ankle edema.  ?Discussed goal BP <140/90, ideally 120-130s systolic.  ?RTC 6 wks f/u visit . ?

## 2022-01-23 NOTE — Assessment & Plan Note (Signed)
Will recheck when rpt CT lungs done ~03/2022.  ?

## 2022-01-23 NOTE — Assessment & Plan Note (Signed)
Rpt CT scan returned reassuring.  ?

## 2022-01-23 NOTE — Progress Notes (Signed)
? ? Patient ID: Todd Mcmahon, male    DOB: 03/22/1956, 66 y.o.   MRN: [id number] ? ?This visit was conducted in person. ? ?BP (!) 148/78 (BP Location: Right Arm, Cuff Size: Normal)   Pulse (!) 42   Temp (!) 97.5 ?F (36.4 ?C) (Temporal)   Ht 5' 7.5" (1.715 m)   Wt 179 lb 4 oz (81.3 kg)   SpO2 97%   BMI 27.66 kg/m?   ?BP Readings from Last 3 Encounters:  ?01/23/22 (!) 148/78  ?10/28/21 (!) 164/82  ?08/29/21 (!) 176/93  ?160s/70s on repeat testing ? ?CC: HTN f/u visit  ?Subjective:  ? ?HPI: ?Ja Ohman is a 66 y.o. male presenting on 01/23/2022 for Follow-up (Here for 6 mo elevated BP f/u.) ? ? ?He fully retired 102/2022 due to health conditions. He doesn't like retirement - bored. Not sleeping well.  ? ?See prior note for details. Seen for welcome to medicare visit 07/2021 - workup revealed beta thalassemia minor and AAA screen then subsequent CT scan revealed 1cm penetrating aortic ulcer in infrarenal abd aorta s/p VVS evaluation - planning yearly monitoring (Dr Karin Lieu).  ?Saw GI, tested negative for H pylori and celiac disease. Due to inguinal hernia containing large intesting, recommended Cologuard for colon cancer screening which returned negative. ? ?Lung cancer screening CT returned showing CAC, aort ATH, ascending aortic aneurysm measuring 4.1cm, and 6.82mm L lingular pulm nodule rec rpt T 6 months (CT chest LCS nodule f/u without CM).  ? ?Known h/o ventricular bigeminy.  ? ?HTN - not on antihypertensive.  Does check blood pressures at home: 140-150/70-80s. No low blood pressure readings or symptoms of dizziness/syncope. Denies HA, vision changes, CP/tightness, SOB, leg swelling.  ?   ? ?Relevant past medical, surgical, family and social history reviewed and updated as indicated. Interim medical history since our last visit reviewed. ?Allergies and medications reviewed and updated. ?No outpatient medications prior to visit.  ? ?No facility-administered medications prior to visit.  ?  ? ?Per HPI  unless specifically indicated in ROS section below ?Review of Systems ? ?Objective:  ?BP (!) 148/78 (BP Location: Right Arm, Cuff Size: Normal)   Pulse (!) 42   Temp (!) 97.5 ?F (36.4 ?C) (Temporal)   Ht 5' 7.5" (1.715 m)   Wt 179 lb 4 oz (81.3 kg)   SpO2 97%   BMI 27.66 kg/m?   ?Wt Readings from Last 3 Encounters:  ?01/23/22 179 lb 4 oz (81.3 kg)  ?10/28/21 175 lb 2 oz (79.4 kg)  ?09/23/21 175 lb (79.4 kg)  ?  ?  ?Physical Exam ?Vitals and nursing note reviewed.  ?Constitutional:   ?   Appearance: Normal appearance. He is not ill-appearing.  ?Eyes:  ?   Extraocular Movements: Extraocular movements intact.  ?   Conjunctiva/sclera: Conjunctivae normal.  ?   Pupils: Pupils are equal, round, and reactive to light.  ?Cardiovascular:  ?   Rate and Rhythm: Normal rate and regular rhythm.  ?   Pulses: Normal pulses.  ?   Heart sounds: Normal heart sounds. No murmur heard. ?   Comments: Bigeminy ?Pulmonary:  ?   Effort: Pulmonary effort is normal. No respiratory distress.  ?   Breath sounds: Normal breath sounds. No wheezing, rhonchi or rales.  ?Musculoskeletal:  ?   Right lower leg: No edema.  ?   Left lower leg: No edema.  ?Neurological:  ?   Mental Status: He is alert.  ?Psychiatric:     ?  Mood and Affect: Mood normal.     ?   Behavior: Behavior normal.  ? ?   ?Results for orders placed or performed in visit on 10/31/21  ?H. pylori antigen, stool  ?Result Value Ref Range  ? H pylori Ag, Stl Negative Negative  ? ? ?Assessment & Plan:  ?This visit occurred during the SARS-CoV-2 public health emergency.  Safety protocols were in place, including screening questions prior to the visit, additional usage of staff PPE, and extensive cleaning of exam room while observing appropriate contact time as indicated for disinfecting solutions.  ? ?Problem List Items Addressed This Visit   ? ? Hypertension - Primary  ?  BP remains elevated as well as home readings.  ?Will start amlodipine 5mg  daily, monitoring for ankle edema.   ?Discussed goal BP <140/90, ideally 120-130s systolic.  ?RTC 6 wks f/u visit . ?  ?  ? Relevant Medications  ? amLODipine (NORVASC) 5 MG tablet  ? Beta thalassemia minor  ?  Discussed new diagnosis.  ?  ?  ? Non-recurrent inguinal hernia without obstruction or gangrene  ?  Bilateral, R>L, imaging with evidence of R hernia containing large intestine. Did recommend gen surgery referral. He declines due to financial concerns. Reviewed possible complications of untreated large hernia as well as reasons to seek ER care. He will let me know when ready for gen surgery referral.  ?  ?  ? Ventricular bigeminy  ? Relevant Medications  ? amLODipine (NORVASC) 5 MG tablet  ? Penetrating atherosclerotic ulcer of aorta (HCC)  ?  Appreciate VVS care. Planning yearly monitoring.  ?Discussed importance of better BP control in h/o this.  ?  ?  ? Relevant Medications  ? amLODipine (NORVASC) 5 MG tablet  ? Pulmonary nodule  ?  Lung cancer screening CT returned showing CAC, aort ATH, ascending aortic aneurysm measuring 4.1cm, and 6.57mm L lingular pulm nodule rec rpt T 6 months (CT chest LCS nodule f/u without CM).  ?  ?  ? Atherosclerosis of aorta (HCC)  ? Relevant Medications  ? amLODipine (NORVASC) 5 MG tablet  ? Coronary artery calcification seen on CAT scan  ? Relevant Medications  ? amLODipine (NORVASC) 5 MG tablet  ? Thoracic ascending aortic aneurysm  ?  Will recheck when rpt CT lungs done ~03/2022.  ?  ?  ? Relevant Medications  ? amLODipine (NORVASC) 5 MG tablet  ? External iliac lymphadenopathy  ?  Rpt CT scan returned reassuring.  ?  ?  ?  ? ?Meds ordered this encounter  ?Medications  ? amLODipine (NORVASC) 5 MG tablet  ?  Sig: Take 1 tablet (5 mg total) by mouth daily.  ?  Dispense:  90 tablet  ?  Refill:  3  ? ?No orders of the defined types were placed in this encounter. ? ? ? ?Patient Instructions  ?Let [birth year]21[birth year]1993 know when ready for general surgery evaluation for referral.  ?Start amlodipine 5mg  daily.  ?Continue monitoring blood  pressures let us know how they're running.  ?Goal blood pressure is <140/90, ideally I want yours closer to 120-130 on top.  ?Return in 6 weeks for blood pressure check.  ?Good to see you today.  ? ?Follow up plan: ?Return in about 6 weeks (around 03/06/2022) for follow up visit. ? ?[place], MD   ?

## 2022-03-13 ENCOUNTER — Ambulatory Visit (INDEPENDENT_AMBULATORY_CARE_PROVIDER_SITE_OTHER): Payer: Medicare HMO | Admitting: Family Medicine

## 2022-03-13 VITALS — BP 136/74 | HR 41 | Temp 97.5°F | Ht 67.5 in | Wt 179.4 lb

## 2022-03-13 DIAGNOSIS — R911 Solitary pulmonary nodule: Secondary | ICD-10-CM

## 2022-03-13 DIAGNOSIS — I7121 Aneurysm of the ascending aorta, without rupture: Secondary | ICD-10-CM

## 2022-03-13 DIAGNOSIS — I10 Essential (primary) hypertension: Secondary | ICD-10-CM | POA: Diagnosis not present

## 2022-03-13 DIAGNOSIS — I498 Other specified cardiac arrhythmias: Secondary | ICD-10-CM

## 2022-03-13 MED ORDER — AMLODIPINE BESYLATE 10 MG PO TABS: 10.0000 mg | ORAL_TABLET | Freq: Every day | ORAL | 2 refills | Status: DC

## 2022-03-13 NOTE — Patient Instructions (Addendum)
BP averaging too high - try increasing amlodipine to 10mg  daily, double up on what you have at home. New dose will be 10mg  tablets.  ?Continue to monitor blood pressures at home, let know if any trouble tolerating this.  ?Return in 2-3 months for follow up visit.  ?

## 2022-03-13 NOTE — Assessment & Plan Note (Signed)
Chronic, remaining uncontrolled on average at home as well as in office today. Increase amlodipine to 10mg  daily, update with effect. RTC 3 mo HTN f/u visit.  ?

## 2022-03-13 NOTE — Assessment & Plan Note (Signed)
Again noted on auscultation.  ?

## 2022-03-13 NOTE — Assessment & Plan Note (Addendum)
Discussed tight BP control to prevent progression.  ?Will likely recheck this at upcoming chest CT next month.  ?

## 2022-03-13 NOTE — Progress Notes (Signed)
? ? Patient ID: Todd Mcmahon, male    DOB: 1956/03/02, 66 y.o.   MRN: [id number] ? ?This visit was conducted in person. ? ?BP 136/74 (BP Location: Left Arm, Cuff Size: Normal)   Pulse (!) 41   Temp (!) 97.5 ?F (36.4 ?C) (Temporal)   Ht 5' 7.5" (1.715 m)   Wt 179 lb 6 oz (81.4 kg)   SpO2 96%   BMI 27.68 kg/m?   ? ?CC: HTN f/u visit  ?Subjective:  ? ?HPI: ?Todd Mcmahon is a 66 y.o. male presenting on 03/13/2022 for Hypertension (Here for f/u.) ? ? ?See prior note for details.  ?HTN - Compliant with current antihypertensive regimen of amlodipine 5mg  daily. Initially felt mildly dizzy on med, but since he's tolerating better. Does check blood pressures at home: 124-178/67-80s. Average 150/74 at home. HR stays 40s. No low blood pressure readings or symptoms of dizziness/syncope. Denies HA, vision changes, CP/tightness, SOB, leg swelling.  ? ?He's been riding bicycle 2-5 miles regularly.  ? ?He's been taking OTC plain mucinex for sinus congestion.  ? ?Due for rpt chest CT ~03/2022.  ?   ? ?Relevant past medical, surgical, family and social history reviewed and updated as indicated. Interim medical history since our last visit reviewed. ?Allergies and medications reviewed and updated. ?Outpatient Medications Prior to Visit  ?Medication Sig Dispense Refill  ? amLODipine (NORVASC) 5 MG tablet Take 1 tablet (5 mg total) by mouth daily. 90 tablet 3  ? ?No facility-administered medications prior to visit.  ?  ? ?Per HPI unless specifically indicated in ROS section below ?Review of Systems ? ?Objective:  ?BP 136/74 (BP Location: Left Arm, Cuff Size: Normal)   Pulse (!) 41   Temp (!) 97.5 ?F (36.4 ?C) (Temporal)   Ht 5' 7.5" (1.715 m)   Wt 179 lb 6 oz (81.4 kg)   SpO2 96%   BMI 27.68 kg/m?   ?Wt Readings from Last 3 Encounters:  ?03/13/22 179 lb 6 oz (81.4 kg)  ?01/23/22 179 lb 4 oz (81.3 kg)  ?10/28/21 175 lb 2 oz (79.4 kg)  ?  ?  ?Physical Exam ?Nursing note reviewed.  ?Constitutional:   ?   Appearance:  Normal appearance. He is not ill-appearing.  ?HENT:  ?   Mouth/Throat:  ?   Mouth: Mucous membranes are moist.  ?   Pharynx: Oropharynx is clear. No oropharyngeal exudate.  ?Eyes:  ?   Extraocular Movements: Extraocular movements intact.  ?   Pupils: Pupils are equal, round, and reactive to light.  ?Neck:  ?   Thyroid: No thyroid mass or thyromegaly.  ?Cardiovascular:  ?   Rate and Rhythm: Normal rate and regular rhythm.  ?   Pulses: Normal pulses.  ?   Heart sounds: Normal heart sounds. No murmur heard. ?Pulmonary:  ?   Effort: Pulmonary effort is normal. No respiratory distress.  ?   Breath sounds: Normal breath sounds. No wheezing, rhonchi or rales.  ?Musculoskeletal:  ?   Right lower leg: No edema.  ?   Left lower leg: No edema.  ?Neurological:  ?   Mental Status: He is alert.  ?Psychiatric:     ?   Mood and Affect: Mood normal.     ?   Behavior: Behavior normal.  ? ?   ?Results for orders placed or performed in visit on 10/31/21  ?H. pylori antigen, stool  ?Result Value Ref Range  ? H pylori Ag, Stl Negative Negative  ? ? ?Assessment &  Plan:  ? ?Problem List Items Addressed This Visit   ? ? Hypertension - Primary  ?  Chronic, remaining uncontrolled on average at home as well as in office today. Increase amlodipine to 10mg  daily, update with effect. RTC 3 mo HTN f/u visit.  ? ?  ?  ? Relevant Medications  ? amLODipine (NORVASC) 10 MG tablet  ? Ventricular bigeminy  ?  Again noted on auscultation.  ? ?  ?  ? Relevant Medications  ? amLODipine (NORVASC) 10 MG tablet  ? Pulmonary nodule  ?  Planned rpt CT next month through lung cancer screening program.  ? ?  ?  ? Thoracic ascending aortic aneurysm (HCC)  ?  Discussed tight BP control to prevent progression.  ?Will likely recheck this at upcoming chest CT next month.  ? ?  ?  ? Relevant Medications  ? amLODipine (NORVASC) 10 MG tablet  ?  ? ?Meds ordered this encounter  ?Medications  ? amLODipine (NORVASC) 10 MG tablet  ?  Sig: Take 1 tablet (10 mg total) by mouth  daily.  ?  Dispense:  90 tablet  ?  Refill:  2  ?  Note new dose  ? ?No orders of the defined types were placed in this encounter. ? ? ? ?Patient Instructions  ?BP averaging too high - try increasing amlodipine to 10mg  daily, double up on what you have at home. New dose will be 10mg  tablets.  ?Continue to monitor blood pressures at home, let know if any trouble tolerating this.  ?Return in 2-3 months for follow up visit.  ? ?Follow up plan: ?Return in about 3 months (around 06/12/2022) for follow up visit. ? ? , MD   ?

## 2022-03-13 NOTE — Assessment & Plan Note (Signed)
Planned rpt CT next month through lung cancer screening program.  ?

## 2022-05-04 ENCOUNTER — Ambulatory Visit
Admission: RE | Admit: 2022-05-04 | Discharge: 2022-05-04 | Disposition: A | Discharge status: Home / Self Care | Payer: Medicare HMO | Source: Ambulatory Visit | Attending: Acute Care | Admitting: Acute Care

## 2022-05-04 DIAGNOSIS — R911 Solitary pulmonary nodule: Secondary | ICD-10-CM

## 2022-05-04 DIAGNOSIS — J439 Emphysema, unspecified: Secondary | ICD-10-CM | POA: Diagnosis not present

## 2022-05-04 DIAGNOSIS — I700 Atherosclerosis of aorta: Secondary | ICD-10-CM | POA: Diagnosis not present

## 2022-05-07 ENCOUNTER — Other Ambulatory Visit: Payer: Self-pay

## 2022-05-07 ENCOUNTER — Telehealth: Payer: Self-pay

## 2022-05-07 DIAGNOSIS — F17210 Nicotine dependence, cigarettes, uncomplicated: Secondary | ICD-10-CM

## 2022-05-07 DIAGNOSIS — Z87891 Personal history of nicotine dependence: Secondary | ICD-10-CM

## 2022-05-07 DIAGNOSIS — Z122 Encounter for screening for malignant neoplasm of respiratory organs: Secondary | ICD-10-CM

## 2022-05-07 NOTE — Telephone Encounter (Signed)
Contacted patient by phone to review results of LDCT.  No suspicious lung nodules noted. Area of concern has cleared and appears to be scarring.  Patient states he has worked in [employer] with dust and hazardous particles in the past.  Emphysema and atherosclerosis, as noted previously. Thoracic aortic aneurysm is noted as stable.  Discussed with patient importance of blood pressure control.  Patient states he had recent increase in dosage of blood pressure medication.  He has increased walking and exercise and trying to better his health.  There was a noted left thyroid nodule on the scan, with recommendation of possible need for thyroid ultrasound.  Advised patient the results and note of attention to thyroid nodule will be faxed to PCP.  Patient should follow up with PCP in two weeks if he has not heard from PCP office to discuss any further tests or recommendations.  Patient acknowledged understanding and had no questions.  Results faxed to PCP.  Order placed for 2024 LDCT.

## 2022-06-12 ENCOUNTER — Encounter: Payer: Self-pay | Admitting: Family Medicine

## 2022-06-12 ENCOUNTER — Ambulatory Visit (INDEPENDENT_AMBULATORY_CARE_PROVIDER_SITE_OTHER): Payer: Medicare HMO | Admitting: Family Medicine

## 2022-06-12 VITALS — BP 140/80 | HR 76 | Temp 97.4°F | Ht 67.5 in | Wt 184.2 lb

## 2022-06-12 DIAGNOSIS — F5104 Psychophysiologic insomnia: Secondary | ICD-10-CM | POA: Insufficient documentation

## 2022-06-12 DIAGNOSIS — I719 Aortic aneurysm of unspecified site, without rupture: Secondary | ICD-10-CM

## 2022-06-12 DIAGNOSIS — I10 Essential (primary) hypertension: Secondary | ICD-10-CM | POA: Diagnosis not present

## 2022-06-12 DIAGNOSIS — F17200 Nicotine dependence, unspecified, uncomplicated: Secondary | ICD-10-CM | POA: Diagnosis not present

## 2022-06-12 DIAGNOSIS — E66811 Obesity, class 1: Secondary | ICD-10-CM | POA: Insufficient documentation

## 2022-06-12 DIAGNOSIS — E663 Overweight: Secondary | ICD-10-CM

## 2022-06-12 DIAGNOSIS — E041 Nontoxic single thyroid nodule: Secondary | ICD-10-CM | POA: Diagnosis not present

## 2022-06-12 DIAGNOSIS — G4700 Insomnia, unspecified: Secondary | ICD-10-CM | POA: Diagnosis not present

## 2022-06-12 LAB — TSH: TSH: 0.82 u[IU]/mL (ref 0.35–5.50)

## 2022-06-12 NOTE — Assessment & Plan Note (Signed)
By recent LDCT of lung - will check thyroid US.

## 2022-06-12 NOTE — Assessment & Plan Note (Signed)
Worse during days he's not active.  Discussed sleep hygiene, handout provided. Melatonin only mildly beneficial.

## 2022-06-12 NOTE — Patient Instructions (Addendum)
We will order thyroid ultrasound for further evaluation of thyroid nodule, and refer you to nutritionist to come up with meal plan.  Thyroid blood test today.  Blood pressures are doing ok today - continue amlodipine 10mg  daily.  DASH diet provided today.  Return in 3 months for wellness visit/physical.   Sleep hygiene checklist: 1. Avoid naps during the day 2. Avoid stimulants such as caffeine and nicotine. Avoid bedtime alcohol (it can speed onset of sleep but the body's metabolism can cause awakenings). 3. All forms of exercise help ensure sound sleep - limit vigorous exercise to morning or late afternoon 4. Avoid food too close to bedtime including chocolate (which contains caffeine) 5. Soak up natural light 6. Establish regular bedtime routine. 7. Associate bed with sleep - avoid TV, computer or phone, reading while in bed. 8. Ensure pleasant, relaxing sleep environment - quiet, dark, cool room.   DASH Eating Plan DASH stands for Dietary Approaches to Stop Hypertension. The DASH eating plan is a healthy eating plan that has been shown to: Reduce high blood pressure (hypertension). Reduce your risk for type 2 diabetes, heart disease, and stroke. Help with weight loss. What are tips for following this plan? Reading food labels Check food labels for the amount of salt (sodium) per serving. Choose foods with less than 5 percent of the Daily Value of sodium. Generally, foods with less than 300 milligrams (mg) of sodium per serving fit into this eating plan. To find whole grains, look for the word "whole" as the first word in the ingredient list. Shopping Buy products labeled as "low-sodium" or "no salt added." Buy fresh foods. Avoid canned foods and pre-made or frozen meals. Cooking Avoid adding salt when cooking. Use salt-free seasonings or herbs instead of table salt or sea salt. Check with your health care provider or pharmacist before using salt substitutes. Do not fry foods. Cook  foods using healthy methods such as baking, boiling, grilling, roasting, and broiling instead. Cook with heart-healthy oils, such as olive, canola, avocado, soybean, or sunflower oil. Meal planning  Eat a balanced diet that includes: 4 or more servings of fruits and 4 or more servings of vegetables each day. Try to fill one-half of your plate with fruits and vegetables. 6-8 servings of whole grains each day. Less than 6 oz (170 g) of lean meat, poultry, or fish each day. A 3-oz (85-g) serving of meat is about the same size as a deck of cards. One egg equals 1 oz (28 g). 2-3 servings of low-fat dairy each day. One serving is 1 cup (237 mL). 1 serving of nuts, seeds, or beans 5 times each week. 2-3 servings of heart-healthy fats. Healthy fats called omega-3 fatty acids are found in foods such as walnuts, flaxseeds, fortified milks, and eggs. These fats are also found in cold-water fish, such as sardines, salmon, and mackerel. Limit how much you eat of: Canned or prepackaged foods. Food that is high in trans fat, such as some fried foods. Food that is high in saturated fat, such as fatty meat. Desserts and other sweets, sugary drinks, and other foods with added sugar. Full-fat dairy products. Do not salt foods before eating. Do not eat more than 4 egg yolks a week. Try to eat at least 2 vegetarian meals a week. Eat more home-cooked food and less restaurant, buffet, and fast food. Lifestyle When eating at a restaurant, ask that your food be prepared with less salt or no salt, if possible. If you drink  alcohol: Limit how much you use to: 0-1 drink a day for women who are not pregnant. 0-2 drinks a day for men. Be aware of how much alcohol is in your drink. In the [place] one drink equals one 12 oz bottle of beer (355 mL), one 5 oz glass of wine (148 mL), or one 1 oz glass of hard liquor (44 mL). General information Avoid eating more than 2,300 mg of salt a day. If you have hypertension, you  may need to reduce your sodium intake to 1,500 mg a day. Work with your health care provider to maintain a healthy body weight or to lose weight. Ask what an ideal weight is for you. Get at least 30 minutes of exercise that causes your heart to beat faster (aerobic exercise) most days of the week. Activities may include walking, swimming, or biking. Work with your health care provider or dietitian to adjust your eating plan to your individual calorie needs. What foods should I eat? Fruits All fresh, dried, or frozen fruit. Canned fruit in natural juice (without added sugar). Vegetables Fresh or frozen vegetables (raw, steamed, roasted, or grilled). Low-sodium or reduced-sodium tomato and vegetable juice. Low-sodium or reduced-sodium tomato sauce and tomato paste. Low-sodium or reduced-sodium canned vegetables. Grains Whole-grain or whole-wheat bread. Whole-grain or whole-wheat pasta. Brown rice. Orpah Cobb. Bulgur. Whole-grain and low-sodium cereals. Pita bread. Low-fat, low-sodium crackers. Whole-wheat flour tortillas. Meats and other proteins Skinless chicken or [place]. Ground chicken or [place]. Pork with fat trimmed off. Fish and seafood. Egg whites. Dried beans, peas, or lentils. Unsalted nuts, nut butters, and seeds. Unsalted canned beans. Lean cuts of beef with fat trimmed off. Low-sodium, lean precooked or cured meat, such as sausages or meat loaves. Dairy Low-fat (1%) or fat-free (skim) milk. Reduced-fat, low-fat, or fat-free cheeses. Nonfat, low-sodium ricotta or cottage cheese. Low-fat or nonfat yogurt. Low-fat, low-sodium cheese. Fats and oils Soft margarine without trans fats. Vegetable oil. Reduced-fat, low-fat, or light mayonnaise and salad dressings (reduced-sodium). Canola, safflower, olive, avocado, soybean, and sunflower oils. Avocado. Seasonings and condiments Herbs. Spices. Seasoning mixes without salt. Other foods Unsalted popcorn and pretzels. Fat-free sweets. The  items listed above may not be a complete list of foods and beverages you can eat. Contact a dietitian for more information. What foods should I avoid? Fruits Canned fruit in a light or heavy syrup. Fried fruit. Fruit in cream or butter sauce. Vegetables Creamed or fried vegetables. Vegetables in a cheese sauce. Regular canned vegetables (not low-sodium or reduced-sodium). Regular canned tomato sauce and paste (not low-sodium or reduced-sodium). Regular tomato and vegetable juice (not low-sodium or reduced-sodium). Rosita Fire. Olives. Grains Baked goods made with fat, such as croissants, muffins, or some breads. Dry pasta or rice meal packs. Meats and other proteins Fatty cuts of meat. Ribs. Fried meat. Tomasa Blase. Bologna, salami, and other precooked or cured meats, such as sausages or meat loaves. Fat from the back of a pig (fatback). Bratwurst. Salted nuts and seeds. Canned beans with added salt. Canned or smoked fish. Whole eggs or egg yolks. Chicken or [place] with skin. Dairy Whole or 2% milk, cream, and half-and-half. Whole or full-fat cream cheese. Whole-fat or sweetened yogurt. Full-fat cheese. Nondairy creamers. Whipped toppings. Processed cheese and cheese spreads. Fats and oils Butter. Stick margarine. Lard. Shortening. Ghee. Bacon fat. Tropical oils, such as coconut, palm kernel, or palm oil. Seasonings and condiments Onion salt, garlic salt, seasoned salt, table salt, and sea salt. Worcestershire sauce. Tartar sauce. Barbecue sauce. Teriyaki sauce. Soy  sauce, including reduced-sodium. Steak sauce. Canned and packaged gravies. Fish sauce. Oyster sauce. Cocktail sauce. Store-bought horseradish. Ketchup. Mustard. Meat flavorings and tenderizers. Bouillon cubes. Hot sauces. Pre-made or packaged marinades. Pre-made or packaged taco seasonings. Relishes. Regular salad dressings. Other foods Salted popcorn and pretzels. The items listed above may not be a complete list of foods and beverages you  should avoid. Contact a dietitian for more information. Where to find more information National Heart, Lung, and Blood Institute: PopSteam.is American Heart Association: www.heart.org Academy of Nutrition and Dietetics: www.eatright.org National Kidney Foundation: www.kidney.org Summary The DASH eating plan is a healthy eating plan that has been shown to reduce high blood pressure (hypertension). It may also reduce your risk for type 2 diabetes, heart disease, and stroke. When on the DASH eating plan, aim to eat more fresh fruits and vegetables, whole grains, lean proteins, low-fat dairy, and heart-healthy fats. With the DASH eating plan, you should limit salt (sodium) intake to 2,300 mg a day. If you have hypertension, you may need to reduce your sodium intake to 1,500 mg a day. Work with your health care provider or dietitian to adjust your eating plan to your individual calorie needs. This information is not intended to replace advice given to you by your health care provider. Make sure you discuss any questions you have with your health care provider. Document Revised: 10/13/2019 Document Reviewed: 10/13/2019 Elsevier Patient Education  2023 [employer].

## 2022-06-12 NOTE — Assessment & Plan Note (Signed)
Appreciate VVS care - sees yearly in October.

## 2022-06-12 NOTE — Assessment & Plan Note (Signed)
Refer to nutritionist per pt request. 

## 2022-06-12 NOTE — Progress Notes (Signed)
Patient ID: Todd Mcmahon, male    DOB: 12-Sep-1956, 66 y.o.   MRN: [id number]  This visit was conducted in person.  BP 140/80 (BP Location: Right Arm, Cuff Size: Normal)   Pulse 76   Temp (!) 97.4 F (36.3 C) (Temporal)   Ht 5' 7.5" (1.715 m)   Wt 184 lb 4 oz (83.6 kg)   SpO2 96%   BMI 28.43 kg/m    CC: 3 mo f/u visit  Subjective:   HPI: Todd Mcmahon is a 66 y.o. male presenting on 06/12/2022 for Hypertension (Here for 3 mo f/u.)   Recent lung cancer screening LDCT - scarring, stable thoracic aortic aneurysm, L thyroid nodule - pt asxs from this standpoint.  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   HTN - Compliant with current antihypertensive regimen of amlodipine 10mg  daily - recent increase due to persistently elevated bp readings at home. Does check blood pressures at home: 133/70. No low blood pressure readings or symptoms of dizziness/syncope. Denies HA, vision changes, CP/tightness, SOB, leg swelling.   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 Notes he sleeps better on days he works. Averaging 4 hours/night. Tried melatonin with mild benefit.   Interested in seeing dietician.      Relevant past medical, surgical, family and social history reviewed and updated as indicated. Interim medical history since our last visit reviewed. Allergies and medications reviewed and updated. Outpatient Medications Prior to Visit  Medication Sig Dispense Refill   amLODipine (NORVASC) 10 MG tablet Take 1 tablet (10 mg total) by mouth daily. 90 tablet 2   No facility-administered medications prior to visit.     Per HPI unless specifically indicated in ROS section below Review of Systems  Objective:  BP 140/80 (BP Location: Right Arm, Cuff Size: Normal)   Pulse 76   Temp  (!) 97.4 F (36.3 C) (Temporal)   Ht 5' 7.5" (1.715 m)   Wt 184 lb 4 oz (83.6 kg)   SpO2 96%   BMI 28.43 kg/m   Wt Readings from Last 3 Encounters:  06/12/22 184 lb 4 oz (83.6 kg)  03/13/22 179 lb 6 oz (81.4 kg)  01/23/22 179 lb 4 oz (81.3 kg)      Physical Exam Vitals and nursing note reviewed.  Constitutional:      Appearance: Normal appearance. He is not ill-appearing.  Neck:     Thyroid: Thyroid mass (possible R sided, no L nodule appreciated) present. No thyromegaly or thyroid tenderness.  Cardiovascular:     Rate and Rhythm: Normal rate and regular rhythm.     Pulses: Normal pulses.     Heart sounds: Normal heart sounds. No murmur heard. Pulmonary:     Effort: Pulmonary effort is normal. No respiratory distress.     Breath sounds: Normal breath sounds. No wheezing, rhonchi or rales.  Musculoskeletal:     Right lower leg: No edema.     Left lower leg: No edema.  Skin:    General: Skin is warm and dry.  Neurological:     Mental Status: He is alert.  Psychiatric:        Mood and Affect: Mood normal.        Behavior: Behavior normal.       Results for orders placed  or performed in visit on 10/31/21  H. pylori antigen, stool  Result Value Ref Range   H pylori Ag, Stl Negative Negative   Lab Results  Component Value Date   CREATININE 0.85 07/25/2021   BUN 11 07/25/2021   NA 137 07/25/2021   K 4.1 07/25/2021   CL 102 07/25/2021   CO2 28 07/25/2021   Assessment & Plan:   Problem List Items Addressed This Visit     Smoker    Down to some day smoker. Encouraged full cessation.       Hypertension - Primary    Chronic, stable on current regimen in office and at home.  Requests referral to nutritionist.       Relevant Orders   Amb ref to Medical Nutrition Therapy-MNT   Penetrating atherosclerotic ulcer of aorta (HCC)    Appreciate [facility] care - sees yearly in October.       Overweight with body mass index (BMI) 25.0-29.9    Refer to nutritionist per pt  request.       Relevant Orders   Amb ref to Medical Nutrition Therapy-[facility]NT   Thyroid nodule    By recent LDCT of lung - will check thyroid US.       Relevant Orders   TSH   US THYROID   Insomnia    Worse during days he's not active.  Discussed sleep hygiene, handout provided. Melatonin only mildly beneficial.         No orders of the defined types were placed in this encounter.  Orders Placed This Encounter  Procedures   US THYROID    Standing Status:   Future    Standing Expiration Date:   06/13/2023    Order Specific Question:   Reason for Exam (SYMPTOM  OR DIAGNOSIS REQUIRED)    Answer:   possible L thyroid nodule by LDCT lung    Order Specific Question:   Preferred imaging location?    Answer:   [facility]   TSH   Amb ref to Medical Nutrition Therapy-MNT    Referral Priority:   Routine    Referral Type:   Consultation    Referral Reason:   Specialty Services Required    Requested Specialty:   Nutrition    Number of Visits Requested:   1    Patient instructions: We will order thyroid ultrasound for further evaluation of thyroid nodule, and refer you to nutritionist to come up with meal plan.  Thyroid blood test today.  Blood pressures are doing ok today - continue amlodipine 10mg  daily.  DASH diet provided today.  Return in 3 months for wellness visit/physical.   Sleep hygiene checklist: 1. Avoid naps during the day 2. Avoid stimulants such as caffeine and nicotine. Avoid bedtime alcohol (it can speed onset of sleep but the body's metabolism can cause awakenings). 3. All forms of exercise help ensure sound sleep - limit vigorous exercise to morning or late afternoon 4. Avoid food too close to bedtime including chocolate (which contains caffeine) 5. Soak up natural light 6. Establish regular bedtime routine. 7. Associate bed with sleep - avoid TV, computer or phone, reading while in bed. 8. Ensure pleasant, relaxing sleep environment - quiet, dark,  cool room.   Follow up plan: Return in about 3 months (around 09/12/2022), or if symptoms worsen or fail to improve, for annual exam, prior fasting for blood work, medicare wellness visit.  09/14/2022, MD

## 2022-06-12 NOTE — Assessment & Plan Note (Addendum)
Chronic, stable on current regimen in office and at home.  Requests referral to nutritionist.

## 2022-06-12 NOTE — Assessment & Plan Note (Addendum)
Down to some day smoker. Encouraged full cessation.

## 2022-06-16 ENCOUNTER — Encounter: Payer: Self-pay | Admitting: *Deleted

## 2022-06-24 ENCOUNTER — Ambulatory Visit
Admission: RE | Admit: 2022-06-24 | Discharge: 2022-06-24 | Disposition: A | Discharge status: Home / Self Care | Payer: Medicare HMO | Source: Ambulatory Visit | Attending: Family Medicine | Admitting: Family Medicine

## 2022-06-24 DIAGNOSIS — E041 Nontoxic single thyroid nodule: Secondary | ICD-10-CM | POA: Insufficient documentation

## 2022-06-25 ENCOUNTER — Other Ambulatory Visit: Payer: Self-pay | Admitting: Family Medicine

## 2022-06-25 DIAGNOSIS — E041 Nontoxic single thyroid nodule: Secondary | ICD-10-CM

## 2022-07-02 ENCOUNTER — Ambulatory Visit
Admission: RE | Admit: 2022-07-02 | Discharge: 2022-07-02 | Disposition: A | Discharge status: Home / Self Care | Payer: Medicare HMO | Source: Ambulatory Visit | Attending: Family Medicine | Admitting: Family Medicine

## 2022-07-02 ENCOUNTER — Ambulatory Visit: Payer: Medicare HMO

## 2022-07-02 DIAGNOSIS — E041 Nontoxic single thyroid nodule: Secondary | ICD-10-CM | POA: Insufficient documentation

## 2022-07-02 NOTE — Procedures (Signed)
Successful US guided FNA of left inferior thyroid nodule No complications. See PACS for full report.  Berneta Levins, PA-C 07/02/2022, 1:42 PM

## 2022-07-03 ENCOUNTER — Telehealth: Payer: Self-pay | Admitting: Family Medicine

## 2022-07-03 LAB — CYTOLOGY - NON PAP

## 2022-07-03 NOTE — Telephone Encounter (Signed)
Patient called in wanting to speak with you regarding his lab results. Thank you!

## 2022-07-06 NOTE — Telephone Encounter (Signed)
Spoke with pt relaying lab results (see Labs, Result Notes- 06/12/22) and letting him know I mailed results to him.  Pt verbalizes understanding and expresses his thanks.

## 2022-07-28 ENCOUNTER — Ambulatory Visit (INDEPENDENT_AMBULATORY_CARE_PROVIDER_SITE_OTHER): Payer: Medicare HMO

## 2022-07-28 VITALS — Wt 184.0 lb

## 2022-07-28 DIAGNOSIS — Z0000 Encounter for general adult medical examination without abnormal findings: Secondary | ICD-10-CM

## 2022-07-28 NOTE — Progress Notes (Signed)
Virtual Visit via Telephone Note  I connected with  Todd Mcmahon on 07/28/22 at  1:30 PM EDT by telephone and verified that I am speaking with the correct person using two identifiers.  Location: Patient: home Provider: [facility] Persons participating in the virtual visit: patient/Nurse Health Advisor   I discussed the limitations, risks, security and privacy concerns of performing an evaluation and management service by telephone and the availability of in person appointments. The patient expressed understanding and agreed to proceed.  Interactive audio and video telecommunications were attempted between this nurse and patient, however failed, due to patient having technical difficulties OR patient did not have access to video capability.  We continued and completed visit with audio only.  Some vital signs may be absent or patient reported.   Todd Hope, LPN  Subjective:   Todd Mcmahon is a 66 y.o. male who presents for an Initial Medicare Annual Wellness Visit.  Review of Systems     Cardiac Risk Factors include: advanced age (>23men, >41 women);hypertension;male gender     Objective:    There were no vitals filed for this visit. There is no height or weight on file to calculate BMI.     07/28/2022    1:36 PM  Advanced Directives  Does Patient Have a Medical Advance Directive? No  Would patient like information on creating a medical advance directive? No - Patient declined    Current Medications (verified) Outpatient Encounter Medications as of 07/28/2022  Medication Sig   amLODipine (NORVASC) 10 MG tablet Take 1 tablet (10 mg total) by mouth daily.   No facility-administered encounter medications on file as of 07/28/2022.    Allergies (verified) Patient has no known allergies.   History: Past Medical History:  Diagnosis Date   Arthritis    Chronic back pain    COVID-19 virus infection 03/2021   HTN (hypertension)    Smoker 1972   Past  Surgical History:  Procedure Laterality Date   NO PAST SURGERIES     Family History  Problem Relation Age of Onset   Diverticulitis Son    Panic disorder Daughter    Cancer Neg Hx    CAD Neg Hx    Stroke Neg Hx    Diabetes Neg Hx    Social History   Socioeconomic History   Marital status: Married    Spouse name: Not on file   Number of children: 2   Years of education: Not on file   Highest education level: Not on file  Occupational History   Occupation: Holiday representative  Tobacco Use   Smoking status: Some Days    Packs/day: 0.25    Years: 50.00    Total pack years: 12.50    Types: Cigarettes   Smokeless tobacco: Never  Vaping Use   Vaping Use: Never used  Substance and Sexual Activity   Alcohol use: Yes    Comment: 3-4 beers per day   Drug use: Never   Sexual activity: Not on file  Other Topics Concern   Not on file  Social History Narrative   Lives with wife Todd Mcmahon   Occ: Builder   Edu: HS    Activity: stays active at work   Diet: good water, fruits/vegetables daily   Social Determinants of Health   Financial Resource Strain: Low Risk  (07/28/2022)   Overall Financial Resource Strain (CARDIA)    Difficulty of Paying Living Expenses: Not hard at all  Food Insecurity: No Food Insecurity (  07/28/2022)   Hunger Vital Sign    Worried About Running Out of Food in the Last Year: Never true    Ran Out of Food in the Last Year: Never true  Transportation Needs: No Transportation Needs (07/28/2022)   PRAPARE - Administrator, Civil Service (Medical): No    Lack of Transportation (Non-Medical): No  Physical Activity: Insufficiently Active (07/28/2022)   Exercise Vital Sign    Days of Exercise per Week: 3 days    Minutes of Exercise per Session: 30 min  Stress: No Stress Concern Present (07/28/2022)   [employer] of Occupational Health - Occupational Stress Questionnaire    Feeling of Stress : Only a little  Social Connections: Moderately Isolated (07/28/2022)    Social Connection and Isolation Panel [NHANES]    Frequency of Communication with Friends and Family: More than three times a week    Frequency of Social Gatherings with Friends and Family: Twice a week    Attends Religious Services: Never    Diplomatic Services operational officer: Not on file    Attends Banker Meetings: Never    Marital Status: Married    Tobacco Counseling Ready to quit: Not Answered Counseling given: Not Answered   Clinical Intake:  Pre-visit preparation completed: Yes  Pain : No/denies pain     Nutritional Risks: None Diabetes: No  How often do you need to have someone help you when you read instructions, pamphlets, or other written materials from your doctor or pharmacy?: 1 - Never  Diabetic?no     Information entered by :: Kennedy Bucker, LPN   Activities of Daily Living    07/28/2022    1:37 PM 07/24/2022    9:59 AM  In your present state of health, do you have any difficulty performing the following activities:  Hearing? 0 0  Vision? 0 0  Difficulty concentrating or making decisions? 0 0  Walking or climbing stairs? 0 0  Dressing or bathing? 0 0  Doing errands, shopping? 0 0  Preparing Food and eating ? N N  Using the Toilet? N N  In the past six months, have you accidently leaked urine? N N  Do you have problems with loss of bowel control? N N  Managing your Medications? N N  Managing your Finances? N N  Housekeeping or managing your Housekeeping? N N    Patient Care Team: Todd Boyden, MD as PCP - General (Family Medicine)  Indicate any recent Medical Services you may have received from other than [facility] providers in the past year (date may be approximate).     Assessment:   This is a routine wellness examination for [name].  Hearing/Vision screen Hearing Screening - Comments:: No aids Vision Screening - Comments:: Wears glasses- [facility]  Dietary issues and exercise activities discussed: Current  Exercise Habits: Home exercise routine, Type of exercise: walking, Time (Minutes): 30, Frequency (Times/Week): 3, Weekly Exercise (Minutes/Week): 90, Intensity: Mild   Goals Addressed             This Visit's Progress    DIET - EAT MORE FRUITS AND VEGETABLES         Depression Screen    07/28/2022    1:33 PM 07/25/2021    8:57 AM 01/10/2021   10:43 AM  PHQ 2/9 Scores  PHQ - 2 Score 0 0 0  PHQ- 9 Score 0  1    Fall Risk    07/28/2022  1:37 PM 07/24/2022    9:59 AM 07/25/2021    8:57 AM  Fall Risk   Falls in the past year? 0 0 0  Number falls in past yr: 0 0   Injury with Fall? 0 0   Risk for fall due to : No Fall Risks    Follow up Falls evaluation completed      FALL RISK PREVENTION PERTAINING TO THE HOME:  Any stairs in or around the home? Yes  If so, are there any without handrails? No  Home free of loose throw rugs in walkways, pet beds, electrical cords, etc? Yes  Adequate lighting in your home to reduce risk of falls? Yes   ASSISTIVE DEVICES UTILIZED TO PREVENT FALLS:  Life alert? No  Use of a cane, walker or w/c? No  Grab bars in the bathroom? Yes  Shower chair or bench in shower? No  Elevated toilet seat or a handicapped toilet? Yes   Cognitive Function:        07/28/2022    1:40 PM  6CIT Screen  What Year? 0 points  What month? 0 points  What time? 0 points  Count back from 20 0 points  Months in reverse 0 points  Repeat phrase 0 points  Total Score 0 points    Immunizations Immunization History  Administered Date(s) Administered   PFIZER(Purple Top)SARS-COV-2 Vaccination 02/03/2020, 02/28/2020, 10/13/2020, 12/07/2021   PNEUMOCOCCAL CONJUGATE-20 07/25/2021   Td 11/23/2013    TDAP status: Up to date  Flu Vaccine status: Declined, Education has been provided regarding the importance of this vaccine but patient still declined. Advised may receive this vaccine at local pharmacy or Health Dept. Aware to provide a copy of the vaccination record if  obtained from local pharmacy or Health Dept. Verbalized acceptance and understanding.  Pneumococcal vaccine status: Up to date  Covid-19 vaccine status: Completed vaccines  Qualifies for Shingles Vaccine? Yes   Zostavax completed No   Shingrix Completed?: No.    Education has been provided regarding the importance of this vaccine. Patient has been advised to call insurance company to determine out of pocket expense if they have not yet received this vaccine. Advised may also receive vaccine at local pharmacy or Health Dept. Verbalized acceptance and understanding.  Screening Tests Health Maintenance  Topic Date Due   Zoster Vaccines- Shingrix (1 of 2) Never done   COVID-19 Vaccine (5 - Pfizer series) 02/01/2022   INFLUENZA VACCINE  Never done   TETANUS/TDAP  11/24/2023   Fecal DNA (Cologuard)  11/07/2024   Pneumonia Vaccine 58+ Years old  Completed   Hepatitis C Screening  Completed   HPV VACCINES  Aged Out    Health Maintenance  Health Maintenance Due  Topic Date Due   Zoster Vaccines- Shingrix (1 of 2) Never done   COVID-19 Vaccine (5 - Pfizer series) 02/01/2022   INFLUENZA VACCINE  Never done    Colorectal cancer screening: Type of screening: Cologuard. Completed 11/12/21. Repeat every 3 years  Lung Cancer Screening: (Low Dose CT Chest recommended if Age 91-80 years, 30 pack-year currently smoking OR have quit w/in 15years.) does not qualify.   Additional Screening:  Hepatitis C Screening: does qualify; Completed no  Vision Screening: Recommended annual ophthalmology exams for early detection of glaucoma and other disorders of the eye. Is the patient up to date with their annual eye exam?  Yes  Who is the provider or what is the name of the office in which the patient attends annual  eye exams? Dr.Nice If pt is not established with a provider, would they like to be referred to a provider to establish care? No .   Dental Screening: Recommended annual dental exams for  proper oral hygiene  Community Resource Referral / Chronic Care Management: CRR required this visit?  No   CCM required this visit?  No      Plan:     I have personally reviewed and noted the following in the patient's chart:   Medical and social history Use of alcohol, tobacco or illicit drugs  Current medications and supplements including opioid prescriptions. Patient is not currently taking opioid prescriptions. Functional ability and status Nutritional status Physical activity Advanced directives List of other physicians Hospitalizations, surgeries, and ER visits in previous 12 months Vitals Screenings to include cognitive, depression, and falls Referrals and appointments  In addition, I have reviewed and discussed with patient certain preventive protocols, quality metrics, and best practice recommendations. A written personalized care plan for preventive services as well as general preventive health recommendations were provided to patient.     Todd Hope, LPN   11/28/6061   Nurse Notes: none

## 2022-07-28 NOTE — Patient Instructions (Signed)
Todd Mcmahon , Thank you for taking time to come for your Medicare Wellness Visit. I appreciate your ongoing commitment to your health goals. Please review the following plan we discussed and let me know if I can assist you in the future.   Screening recommendations/referrals: Colonoscopy: Cologuard 11/12/21 Recommended yearly ophthalmology/optometry visit for glaucoma screening and checkup Recommended yearly dental visit for hygiene and checkup  Vaccinations: Influenza vaccine: n/d Pneumococcal vaccine: 07/25/21 Tdap vaccine: 11/23/13 Shingles vaccine: n/d   Covid-19: 02/03/20, 02/28/20, 10/13/20, 12/07/21  Advanced directives: no  Conditions/risks identified: none  Next appointment: Follow up in one year for your annual wellness visit. 08/02/23 @ 11:45 am by phone  Preventive Care 65 Years and Older, Male Preventive care refers to lifestyle choices and visits with your health care provider that can promote health and wellness. What does preventive care include? A yearly physical exam. This is also called an annual well check. Dental exams once or twice a year. Routine eye exams. Ask your health care provider how often you should have your eyes checked. Personal lifestyle choices, including: Daily care of your teeth and gums. Regular physical activity. Eating a healthy diet. Avoiding tobacco and drug use. Limiting alcohol use. Practicing safe sex. Taking low doses of aspirin every day. Taking vitamin and mineral supplements as recommended by your health care provider. What happens during an annual well check? The services and screenings done by your health care provider during your annual well check will depend on your age, overall health, lifestyle risk factors, and family history of disease. Counseling  Your health care provider may ask you questions about your: Alcohol use. Tobacco use. Drug use. Emotional well-being. Home and relationship well-being. Sexual activity. Eating  habits. History of falls. Memory and ability to understand (cognition). Work and work Astronomer. Screening  You may have the following tests or measurements: Height, weight, and BMI. Blood pressure. Lipid and cholesterol levels. These may be checked every 5 years, or more frequently if you are over 5 years old. Skin check. Lung cancer screening. You may have this screening every year starting at age 28 if you have a 30-pack-year history of smoking and currently smoke or have quit within the past 15 years. Fecal occult blood test (FOBT) of the stool. You may have this test every year starting at age 62. Flexible sigmoidoscopy or colonoscopy. You may have a sigmoidoscopy every 5 years or a colonoscopy every 10 years starting at age 55. Prostate cancer screening. Recommendations will vary depending on your family history and other risks. Hepatitis C blood test. Hepatitis B blood test. Sexually transmitted disease (STD) testing. Diabetes screening. This is done by checking your blood sugar (glucose) after you have not eaten for a while (fasting). You may have this done every 1-3 years. Abdominal aortic aneurysm (AAA) screening. You may need this if you are a current or former smoker. Osteoporosis. You may be screened starting at age 3 if you are at high risk. Talk with your health care provider about your test results, treatment options, and if necessary, the need for more tests. Vaccines  Your health care provider may recommend certain vaccines, such as: Influenza vaccine. This is recommended every year. Tetanus, diphtheria, and acellular pertussis (Tdap, Td) vaccine. You may need a Td booster every 10 years. Zoster vaccine. You may need this after age 19. Pneumococcal 13-valent conjugate (PCV13) vaccine. One dose is recommended after age 64. Pneumococcal polysaccharide (PPSV23) vaccine. One dose is recommended after age 33. Talk to your  health care provider about which screenings and  vaccines you need and how often you need them. This information is not intended to replace advice given to you by your health care provider. Make sure you discuss any questions you have with your health care provider. Document Released: 12/06/2015 Document Revised: 07/29/2016 Document Reviewed: 09/10/2015 Elsevier Interactive Patient Education  2017 [employer] Prevention in the Home Falls can cause injuries. They can happen to people of all ages. There are many things you can do to make your home safe and to help prevent falls. What can I do on the outside of my home? Regularly fix the edges of walkways and driveways and fix any cracks. Remove anything that might make you trip as you walk through a door, such as a raised step or threshold. Trim any bushes or trees on the path to your home. Use bright outdoor lighting. Clear any walking paths of anything that might make someone trip, such as rocks or tools. Regularly check to see if handrails are loose or broken. Make sure that both sides of any steps have handrails. Any raised decks and porches should have guardrails on the edges. Have any leaves, snow, or ice cleared regularly. Use sand or salt on walking paths during winter. Clean up any spills in your garage right away. This includes oil or grease spills. What can I do in the bathroom? Use night lights. Install grab bars by the toilet and in the tub and shower. Do not use towel bars as grab bars. Use non-skid mats or decals in the tub or shower. If you need to sit down in the shower, use a plastic, non-slip stool. Keep the floor dry. Clean up any water that spills on the floor as soon as it happens. Remove soap buildup in the tub or shower regularly. Attach bath mats securely with double-sided non-slip rug tape. Do not have throw rugs and other things on the floor that can make you trip. What can I do in the bedroom? Use night lights. Make sure that you have a light by your  bed that is easy to reach. Do not use any sheets or blankets that are too big for your bed. They should not hang down onto the floor. Have a firm chair that has side arms. You can use this for support while you get dressed. Do not have throw rugs and other things on the floor that can make you trip. What can I do in the kitchen? Clean up any spills right away. Avoid walking on wet floors. Keep items that you use a lot in easy-to-reach places. If you need to reach something above you, use a strong step stool that has a grab bar. Keep electrical cords out of the way. Do not use floor polish or wax that makes floors slippery. If you must use wax, use non-skid floor wax. Do not have throw rugs and other things on the floor that can make you trip. What can I do with my stairs? Do not leave any items on the stairs. Make sure that there are handrails on both sides of the stairs and use them. Fix handrails that are broken or loose. Make sure that handrails are as long as the stairways. Check any carpeting to make sure that it is firmly attached to the stairs. Fix any carpet that is loose or worn. Avoid having throw rugs at the top or bottom of the stairs. If you do have throw rugs, attach them  to the floor with carpet tape. Make sure that you have a light switch at the top of the stairs and the bottom of the stairs. If you do not have them, ask someone to add them for you. What else can I do to help prevent falls? Wear shoes that: Do not have high heels. Have rubber bottoms. Are comfortable and fit you well. Are closed at the toe. Do not wear sandals. If you use a stepladder: Make sure that it is fully opened. Do not climb a closed stepladder. Make sure that both sides of the stepladder are locked into place. Ask someone to hold it for you, if possible. Clearly [place] and make sure that you can see: Any grab bars or handrails. First and last steps. Where the edge of each step is. Use tools that  help you move around (mobility aids) if they are needed. These include: Canes. Walkers. Scooters. Crutches. Turn on the lights when you go into a dark area. Replace any light bulbs as soon as they burn out. Set up your furniture so you have a clear path. Avoid moving your furniture around. If any of your floors are uneven, fix them. If there are any pets around you, be aware of where they are. Review your medicines with your doctor. Some medicines can make you feel dizzy. This can increase your chance of falling. Ask your doctor what other things that you can do to help prevent falls. This information is not intended to replace advice given to you by your health care provider. Make sure you discuss any questions you have with your health care provider. Document Released: 09/05/2009 Document Revised: 04/16/2016 Document Reviewed: 12/14/2014 Elsevier Interactive Patient Education  2017 [employer].

## 2022-08-19 IMAGING — CT CT CHEST LCS NODULE FOLLOW-UP W/O CM
1 series · 10 of 10 positions shown, 13 images · non-contrast
Comparison: 09/23/2021

CLINICAL DATA: Lung cancer screening. Current asymptomatic smoker
with 35 pack-year history. Follow-up lung nodule.

EXAM:
CT CHEST WITHOUT CONTRAST FOR LUNG CANCER SCREENING NODULE FOLLOW-UP
TECHNIQUE: Multidetector CT imaging of the chest was performed following the
standard protocol without IV contrast.
RADIATION DOSE REDUCTION: This exam was performed according to the
departmental dose-optimization program which includes automated
exposure control, adjustment of the mA and/or kV according to
patient size and/or use of iterative reconstruction technique.

[ct lung segmentation data · axial · 0.78mm/px · z∈[-373,-373]mm · 10 of 174 frames shown]
[frame 1/174  mediastinal]
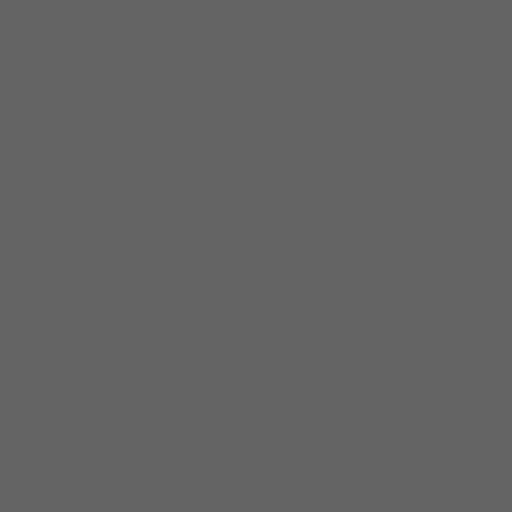
[frame 1/174  lung]
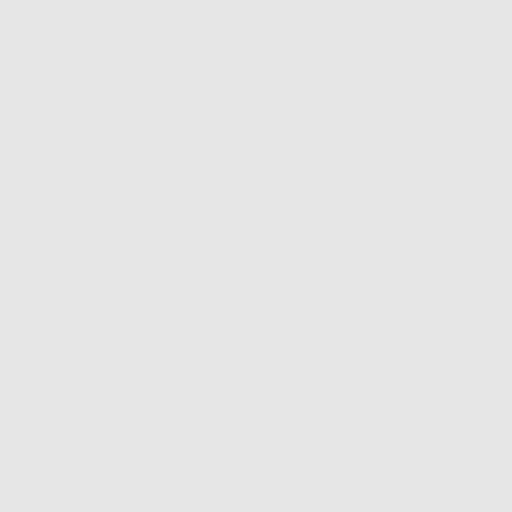
[frame 20/174  lung]
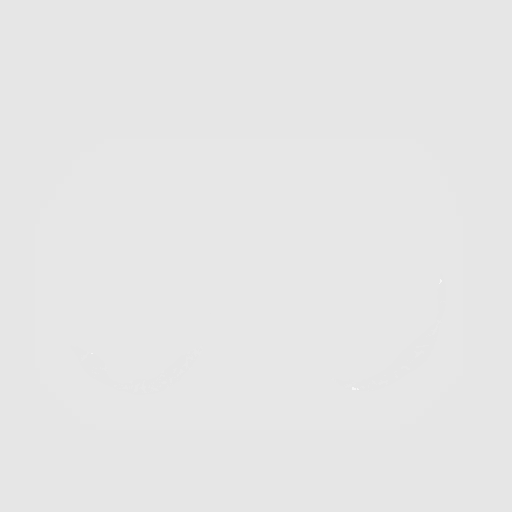
[frame 39/174  lung]
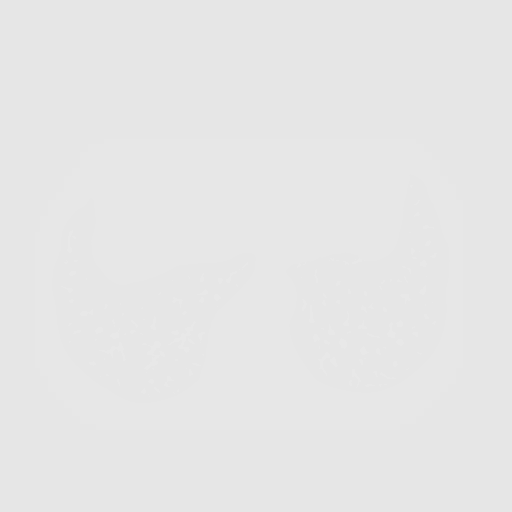
[frame 58/174  lung]
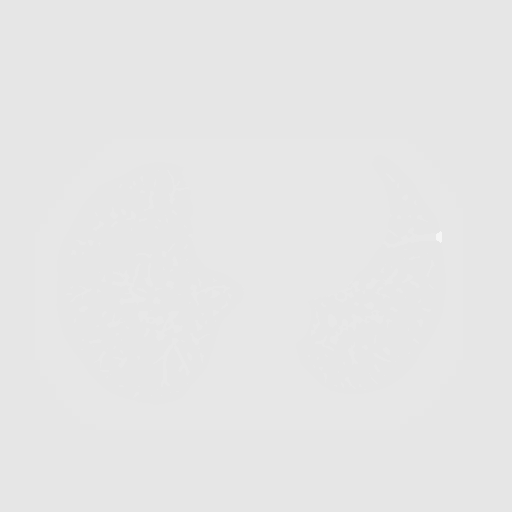
[frame 77/174  mediastinal]
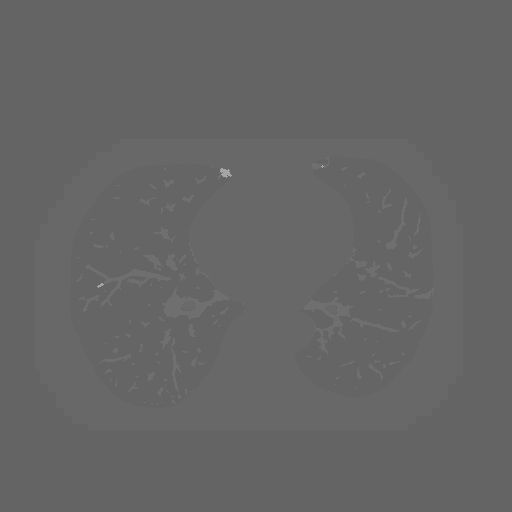
[frame 77/174  lung]
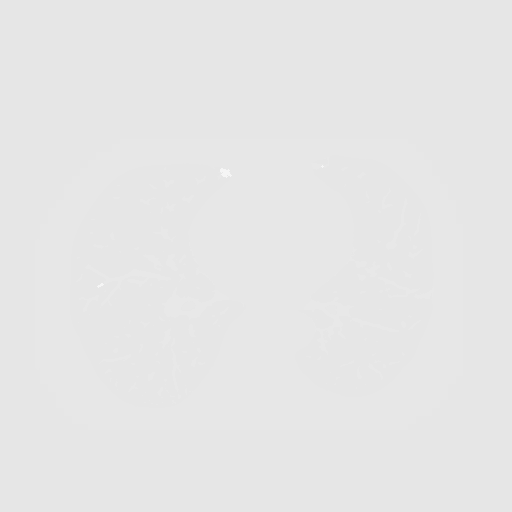
[frame 97/174  lung]
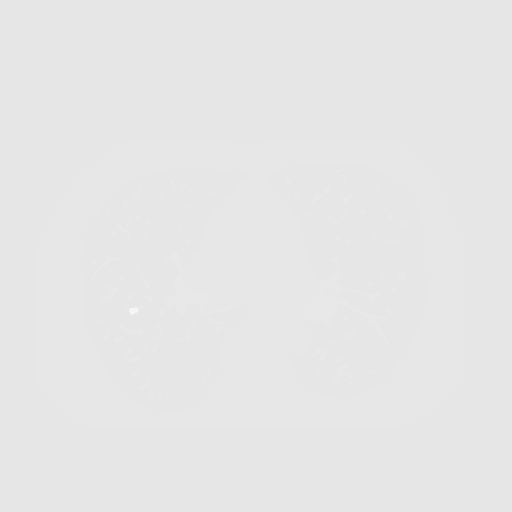
[frame 116/174  lung]
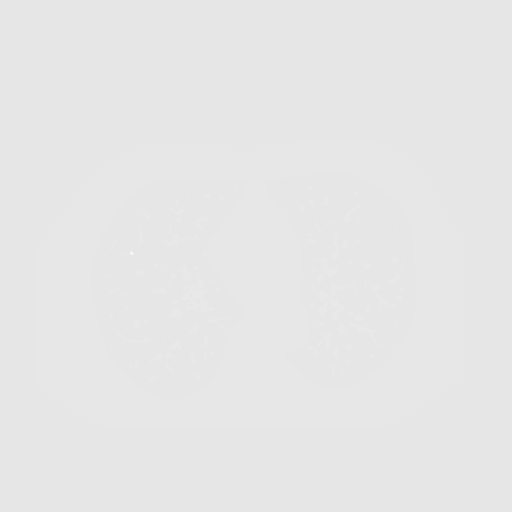
[frame 135/174  lung]
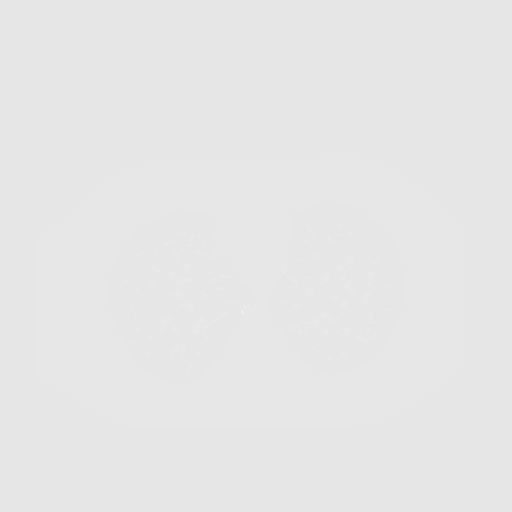
[frame 154/174  mediastinal]
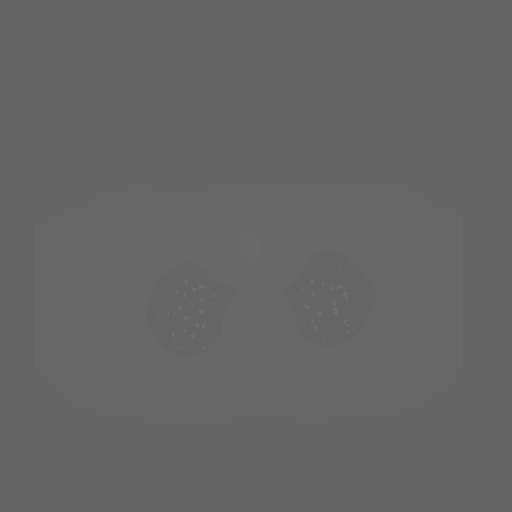
[frame 154/174  lung]
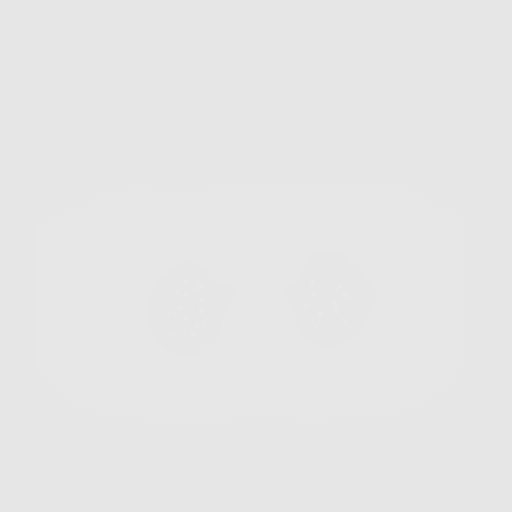
[frame 174/174  lung]
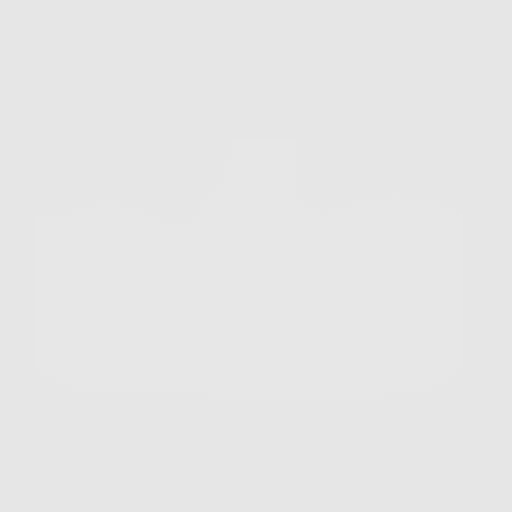

[10 of 10 positions shown; findings below may reference images not displayed]

FINDINGS: Cardiovascular: Heart size appears within normal limits. Aortic
atherosclerosis and coronary artery atherosclerotic calcifications.
No pericardial effusion. Stable ascending thoracic aortic aneurysm
measuring 4.1 cm, image 33/2.

Mediastinum/Nodes: No enlarged mediastinal, hilar, or axillary lymph
nodes. 1.9 cm low-density nodule noted within inferior pole of left
lobe of thyroid gland. Trachea and esophagus demonstrate no
significant findings.

Lungs/Pleura: Centrilobular emphysema. Previously noted nodular
density within the lingula has evolved into a bandlike area of
scarring extending into the posterior right upper lobe. No new lung
nodules identified. No pleural effusion, airspace consolidation or
pneumothorax.

Upper Abdomen: No acute abnormality.

Musculoskeletal: No chest wall mass or suspicious bone lesions
identified.
IMPRESSION: 1. Lung-RADS 1, negative. Continue annual screening with low-dose
chest CT without contrast in 12 months.
2. Stable ascending thoracic aortic aneurysm measuring 4.1 cm. This
can be readdressed at the next annual lung cancer screening CT.
[DATE] cm low-density nodule within left lobe of thyroid gland
noted. Recommend thyroid US (ref: [HOSPITAL]. [DATE]):
4. Aortic Atherosclerosis (8JJYI-ME1.1) and Emphysema (8JJYI-F0N.M).

## 2022-09-06 ENCOUNTER — Other Ambulatory Visit: Payer: Self-pay | Admitting: Family Medicine

## 2022-09-06 DIAGNOSIS — I10 Essential (primary) hypertension: Secondary | ICD-10-CM

## 2022-09-06 DIAGNOSIS — Z125 Encounter for screening for malignant neoplasm of prostate: Secondary | ICD-10-CM

## 2022-09-06 DIAGNOSIS — D563 Thalassemia minor: Secondary | ICD-10-CM

## 2022-09-07 ENCOUNTER — Other Ambulatory Visit (INDEPENDENT_AMBULATORY_CARE_PROVIDER_SITE_OTHER): Payer: Medicare HMO

## 2022-09-07 DIAGNOSIS — D563 Thalassemia minor: Secondary | ICD-10-CM | POA: Diagnosis not present

## 2022-09-07 DIAGNOSIS — I10 Essential (primary) hypertension: Secondary | ICD-10-CM

## 2022-09-07 DIAGNOSIS — Z125 Encounter for screening for malignant neoplasm of prostate: Secondary | ICD-10-CM | POA: Diagnosis not present

## 2022-09-07 LAB — PSA: PSA: 3.59 ng/mL (ref 0.10–4.00)

## 2022-09-07 LAB — MICROALBUMIN / CREATININE URINE RATIO
Creatinine,U: 226 mg/dL
Microalb Creat Ratio: 1.5 mg/g (ref 0.0–30.0)
Microalb, Ur: 3.3 mg/dL — ABNORMAL HIGH (ref 0.0–1.9)

## 2022-09-07 LAB — CBC WITH DIFFERENTIAL/PLATELET
Basophils Absolute: 0.1 10*3/uL (ref 0.0–0.1)
Basophils Relative: 0.8 % (ref 0.0–3.0)
Eosinophils Absolute: 0.1 10*3/uL (ref 0.0–0.7)
Eosinophils Relative: 1.1 % (ref 0.0–5.0)
HCT: 41.3 % (ref 39.0–52.0)
Hemoglobin: 13 g/dL (ref 13.0–17.0)
Lymphocytes Relative: 19 % (ref 12.0–46.0)
Lymphs Abs: 1.6 10*3/uL (ref 0.7–4.0)
MCHC: 31.4 g/dL (ref 30.0–36.0)
MCV: 71.4 fl — ABNORMAL LOW (ref 78.0–100.0)
Monocytes Absolute: 1 10*3/uL (ref 0.1–1.0)
Monocytes Relative: 11.3 % (ref 3.0–12.0)
Neutro Abs: 5.8 10*3/uL (ref 1.4–7.7)
Neutrophils Relative %: 67.8 % (ref 43.0–77.0)
Platelets: 229 10*3/uL (ref 150.0–400.0)
RBC: 5.79 Mil/uL (ref 4.22–5.81)
RDW: 15.7 % — ABNORMAL HIGH (ref 11.5–15.5)
WBC: 8.5 10*3/uL (ref 4.0–10.5)

## 2022-09-07 LAB — BASIC METABOLIC PANEL
BUN: 9 mg/dL (ref 6–23)
CO2: 26 mEq/L (ref 19–32)
Calcium: 8.8 mg/dL (ref 8.4–10.5)
Chloride: 101 mEq/L (ref 96–112)
Creatinine, Ser: 0.86 mg/dL (ref 0.40–1.50)
GFR: 90.04 mL/min (ref >60.00)
Glucose, Bld: 136 mg/dL — ABNORMAL HIGH (ref 70–99)
Potassium: 4.5 mEq/L (ref 3.5–5.1)
Sodium: 137 mEq/L (ref 135–145)

## 2022-09-14 ENCOUNTER — Encounter: Payer: Self-pay | Admitting: Family Medicine

## 2022-09-14 ENCOUNTER — Ambulatory Visit (INDEPENDENT_AMBULATORY_CARE_PROVIDER_SITE_OTHER): Payer: Medicare HMO | Admitting: Family Medicine

## 2022-09-14 VITALS — BP 150/80 | HR 88 | Temp 97.2°F | Ht 67.5 in | Wt 177.4 lb

## 2022-09-14 DIAGNOSIS — K4090 Unilateral inguinal hernia, without obstruction or gangrene, not specified as recurrent: Secondary | ICD-10-CM

## 2022-09-14 DIAGNOSIS — I10 Essential (primary) hypertension: Secondary | ICD-10-CM

## 2022-09-14 DIAGNOSIS — Z7189 Other specified counseling: Secondary | ICD-10-CM

## 2022-09-14 DIAGNOSIS — R739 Hyperglycemia, unspecified: Secondary | ICD-10-CM | POA: Diagnosis not present

## 2022-09-14 DIAGNOSIS — I7121 Aneurysm of the ascending aorta, without rupture: Secondary | ICD-10-CM

## 2022-09-14 DIAGNOSIS — D563 Thalassemia minor: Secondary | ICD-10-CM

## 2022-09-14 DIAGNOSIS — F17200 Nicotine dependence, unspecified, uncomplicated: Secondary | ICD-10-CM

## 2022-09-14 DIAGNOSIS — Z0000 Encounter for general adult medical examination without abnormal findings: Secondary | ICD-10-CM | POA: Insufficient documentation

## 2022-09-14 LAB — POCT GLYCOSYLATED HEMOGLOBIN (HGB A1C): Hemoglobin A1C: 5.2 % (ref 4.0–5.6)

## 2022-09-14 NOTE — Assessment & Plan Note (Signed)
Check A1c today.

## 2022-09-14 NOTE — Assessment & Plan Note (Signed)
Continue monitoring with yearly lung cancer screening CT

## 2022-09-14 NOTE — Assessment & Plan Note (Signed)
Advanced directive discussion - discussed. Has this at home. Wife Coralyn Martise would be HCPOA. Asked to bring [place] copy

## 2022-09-14 NOTE — Assessment & Plan Note (Signed)
Chronic, longstanding. Desires to delay repair until after he retires - planning to work another year.

## 2022-09-14 NOTE — Assessment & Plan Note (Signed)
Down to some day smoker. He continues lung cancer screen

## 2022-09-14 NOTE — Progress Notes (Signed)
Patient ID: Todd Mcmahon, male    DOB: 05/24/1956, 66 y.o.   MRN: [id number]  This visit was conducted in person.  BP (!) 150/80   Pulse 88   Temp (!) 97.2 F (36.2 C) (Temporal)   Ht 5' 7.5" (1.715 m)   Wt 177 lb 6 oz (80.5 kg)   SpO2 96%   BMI 27.37 kg/m   150/80 on repeat testing  CC: CPE Subjective:   HPI: Todd Mcmahon is a 66 y.o. male presenting on 09/14/2022 for Annual Exam [facility] prt 2.  Pt brought in home BP monitor to compare.  Reading in office today- 142 90.  Also, pt wants to discuss flu shot. )   Saw health advisor last month for medicare wellness visit. Note reviewed.   No results found.  Flowsheet Row Clinical Support from 07/28/2022 in [place] at [place]  PHQ-2 Total Score 0          07/28/2022    1:37 PM 07/24/2022    9:59 AM 07/25/2021    8:57 AM  Fall Risk   Falls in the past year? 0 0 0  Number falls in past yr: 0 0   Injury with Fall? 0 0   Risk for fall due to : No Fall Risks    Follow up Falls evaluation completed      HTN - continues amlodipine 10mg  daily - takes at night time.  Home readings running 742V systolic.  Last visit we referred to dietician - insurance didn't cover this so he did not see. He's been trying to follow mediterranean diet.   Ongoing sleep initiation insomnia.  Back to working which he enjoys - works closing hours at [employer].   Preventative: Colon cancer screening - cologuard WNL 10/2021 Prostate cancer screening - no nocturia, denies significant prostate symptoms. Yearly PSA.  Lung cancer screening - ~25 PY hx. Started screening CT program 09/2021.  Flu shot - declines as he gets sick x3d each time he gets it.  [place] 01/2020, 02/2020, booster 09/2020, bivalent 11/2021 Tetanus shot - thinks 2015  Prevnar-20 07/2021  Shingrix - discussed - to check with pharmacy due to insurance  Advanced directive discussion - discussed. Has this at home. Wife Todd Brigido would be HCPOA. Asked to  bring [place] copy  Seat belt use discussed Sunscreen use discussed. No changing moles on skin. Sleep -  Smoking - has cut down to 0-2 cig/day - congratulated. Using nicotine patch.  Alcohol - 3-4 drinks/day  Dentist - due Eye exam - yearly Bowel - no constipation  Bladder - no incontinence   Lives with wife Todd Mcmahon Occ: [employer]: HS  Activity: stays active at work Diet: good water, fruits/vegetables daily.     Relevant past medical, surgical, family and social history reviewed and updated as indicated. Interim medical history since our last visit reviewed. Allergies and medications reviewed and updated. Outpatient Medications Prior to Visit  Medication Sig Dispense Refill   amLODipine (NORVASC) 10 MG tablet Take 1 tablet (10 mg total) by mouth daily. 90 tablet 2   No facility-administered medications prior to visit.     Per HPI unless specifically indicated in ROS section below Review of Systems  Constitutional:  Negative for activity change, appetite change, chills, fatigue, fever and unexpected weight change.  HENT:  Negative for hearing loss.   Eyes:  Negative for visual disturbance.  Respiratory:  Positive for cough. Negative for chest tightness, shortness of breath  and wheezing.   Cardiovascular:  Negative for chest pain, palpitations and leg swelling.  Gastrointestinal:  Negative for abdominal distention, abdominal pain, blood in stool, constipation, diarrhea, nausea and vomiting.  Genitourinary:  Negative for difficulty urinating and hematuria.  Musculoskeletal:  Negative for arthralgias, myalgias and neck pain.  Skin:  Negative for rash.  Neurological:  Negative for dizziness, seizures, syncope and headaches.  Hematological:  Negative for adenopathy. Does not bruise/bleed easily.  Psychiatric/Behavioral:  Negative for dysphoric mood. The patient is not nervous/anxious.     Objective:  BP (!) 150/80   Pulse 88   Temp (!) 97.2 F (36.2 C) (Temporal)   Ht 5' 7.5"  (1.715 m)   Wt 177 lb 6 oz (80.5 kg)   SpO2 96%   BMI 27.37 kg/m   Wt Readings from Last 3 Encounters:  09/14/22 177 lb 6 oz (80.5 kg)  07/28/22 184 lb (83.5 kg)  06/12/22 184 lb 4 oz (83.6 kg)      Physical Exam Vitals and nursing note reviewed.  Constitutional:      General: He is not in acute distress.    Appearance: Normal appearance. He is well-developed. He is not ill-appearing.  HENT:     Head: Normocephalic and atraumatic.     Right Ear: Hearing, tympanic membrane, ear canal and external ear normal.     Left Ear: Hearing, tympanic membrane, ear canal and external ear normal.  Eyes:     General: No scleral icterus.    Extraocular Movements: Extraocular movements intact.     Conjunctiva/sclera: Conjunctivae normal.     Pupils: Pupils are equal, round, and reactive to light.  Neck:     Thyroid: No thyroid mass or thyromegaly.  Cardiovascular:     Rate and Rhythm: Normal rate and regular rhythm.     Pulses: Normal pulses.          Radial pulses are 2+ on the right side and 2+ on the left side.     Heart sounds: Normal heart sounds. No murmur heard. Pulmonary:     Effort: Pulmonary effort is normal. No respiratory distress.     Breath sounds: Normal breath sounds. No wheezing, rhonchi or rales.  Abdominal:     General: Bowel sounds are normal. There is no distension.     Palpations: Abdomen is soft. There is no mass.     Tenderness: There is no abdominal tenderness. There is no guarding or rebound.     Hernia: A hernia is present. Hernia is present in the left inguinal area and right inguinal area.  Genitourinary:    Prostate: Normal. Not enlarged (25gm), not tender and no nodules present.     Rectum: Normal. No mass, tenderness, anal fissure, external hemorrhoid or internal hemorrhoid. Normal anal tone.  Musculoskeletal:        General: Normal range of motion.     Cervical back: Normal range of motion and neck supple.     Right lower leg: No edema.     Left lower  leg: No edema.  Lymphadenopathy:     Cervical: No cervical adenopathy.  Skin:    General: Skin is warm and dry.     Findings: No rash.  Neurological:     General: No focal deficit present.     Mental Status: He is alert and oriented to person, place, and time.  Psychiatric:        Mood and Affect: Mood normal.        Behavior:  Behavior normal.        Thought Content: Thought content normal.        Judgment: Judgment normal.       Results for orders placed or performed in visit on 09/14/22  POCT glycosylated hemoglobin (Hb A1C)  Result Value Ref Range   Hemoglobin A1C 5.2 4.0 - 5.6 %   HbA1c POC (<> result, manual entry)     HbA1c, POC (prediabetic range)     HbA1c, POC (controlled diabetic range)     Lab Results  Component Value Date   CHOL 142 07/18/2021   HDL 57.20 07/18/2021   LDLCALC 71 07/18/2021   TRIG 71.0 07/18/2021   CHOLHDL 2 07/18/2021    Assessment & Plan:   Problem List Items Addressed This Visit     Advanced directives, counseling/discussion (Chronic)    Advanced directive discussion - discussed. Has this at home. Wife [name] would be HCPOA. Asked to bring [place] copy       Health maintenance examination - Primary (Chronic)    Preventative protocols reviewed and updated unless pt declined. Discussed healthy diet and lifestyle.       Smoker    Down to some day smoker. He continues lung cancer screen      Hypertension    Chronic, adequate based on home readings. Continue full dose amlodipine.       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 Desires to delay repair until after he retires - planning to work another year.       Thoracic ascending aortic aneurysm (HCC)    Continue monitoring with yearly lung cancer screening CT      Hyperglycemia    Check A1c today.       Relevant Orders   POCT glycosylated hemoglobin (Hb A1C) (Completed)     No orders of the defined types  were placed in this encounter.  Orders Placed This Encounter  Procedures   POCT glycosylated hemoglobin (Hb A1C)    Patient instructions: If interested, check with pharmacy about new 2 shot shingles series (shingrix).  Bring [place] copy of advanced directive to update your chart.  Check A1c today.  Good to see you today  Return as needed or in 6 months for follow up visit.   Follow up plan: Return in about 6 months (around 03/16/2023) for follow up visit.  Eustaquio Boyden, MD

## 2022-09-14 NOTE — Patient Instructions (Addendum)
If interested, check with pharmacy about new 2 shot shingles series (shingrix).  Bring [place] copy of advanced directive to update your chart.  Check A1c today.  Good to see you today  Return as needed or in 6 months for follow up visit.   Health Maintenance After Age 67 After age 80, you are at a higher risk for certain long-term diseases and infections as well as injuries from falls. Falls are a major cause of broken bones and head injuries in people who are older than age 42. Getting regular preventive care can help to keep you healthy and well. Preventive care includes getting regular testing and making lifestyle changes as recommended by your health care provider. Talk with your health care provider about: Which screenings and tests you should have. A screening is a test that checks for a disease when you have no symptoms. A diet and exercise plan that is right for you. What should I know about screenings and tests to prevent falls? Screening and testing are the best ways to find a health problem early. Early diagnosis and treatment give you the best chance of managing medical conditions that are common after age 94. Certain conditions and lifestyle choices may make you more likely to have a fall. Your health care provider may recommend: Regular vision checks. Poor vision and conditions such as cataracts can make you more likely to have a fall. If you wear glasses, make sure to get your prescription updated if your vision changes. Medicine review. Work with your health care provider to regularly review all of the medicines you are taking, including over-the-counter medicines. Ask your health care provider about any side effects that may make you more likely to have a fall. Tell your health care provider if any medicines that you take make you feel dizzy or sleepy. Strength and balance checks. Your health care provider may recommend certain tests to check your strength and balance while standing,  walking, or changing positions. Foot health exam. Foot pain and numbness, as well as not wearing proper footwear, can make you more likely to have a fall. Screenings, including: Osteoporosis screening. Osteoporosis is a condition that causes the bones to get weaker and break more easily. Blood pressure screening. Blood pressure changes and medicines to control blood pressure can make you feel dizzy. Depression screening. You may be more likely to have a fall if you have a fear of falling, feel depressed, or feel unable to do activities that you used to do. Alcohol use screening. Using too much alcohol can affect your balance and may make you more likely to have a fall. Follow these instructions at home: Lifestyle Do not drink alcohol if: Your health care provider tells you not to drink. If you drink alcohol: Limit how much you have to: 0-1 drink a day for women. 0-2 drinks a day for men. Know how much alcohol is in your drink. In the [place] one drink equals one 12 oz bottle of beer (355 mL), one 5 oz glass of wine (148 mL), or one 1 oz glass of hard liquor (44 mL). Do not use any products that contain nicotine or tobacco. These products include cigarettes, chewing tobacco, and vaping devices, such as e-cigarettes. If you need help quitting, ask your health care provider. Activity  Follow a regular exercise program to stay fit. This will help you maintain your balance. Ask your health care provider what types of exercise are appropriate for you. If you need a cane or  walker, use it as recommended by your health care provider. Wear supportive shoes that have nonskid soles. Safety  Remove any tripping hazards, such as rugs, cords, and clutter. Install safety equipment such as grab bars in bathrooms and safety rails on stairs. Keep rooms and walkways well-lit. General instructions Talk with your health care provider about your risks for falling. Tell your health care provider if: You fall.  Be sure to tell your health care provider about all falls, even ones that seem minor. You feel dizzy, tiredness (fatigue), or off-balance. Take over-the-counter and prescription medicines only as told by your health care provider. These include supplements. Eat a healthy diet and maintain a healthy weight. A healthy diet includes low-fat dairy products, low-fat (lean) meats, and fiber from whole grains, beans, and lots of fruits and vegetables. Stay current with your vaccines. Schedule regular health, dental, and eye exams. Summary Having a healthy lifestyle and getting preventive care can help to protect your health and wellness after age 33. Screening and testing are the best way to find a health problem early and help you avoid having a fall. Early diagnosis and treatment give you the best chance for managing medical conditions that are more common for people who are older than age 12. Falls are a major cause of broken bones and head injuries in people who are older than age 68. Take precautions to prevent a fall at home. Work with your health care provider to learn what changes you can make to improve your health and wellness and to prevent falls. This information is not intended to replace advice given to you by your health care provider. Make sure you discuss any questions you have with your health care provider. Document Revised: 03/31/2021 Document Reviewed: 03/31/2021 Elsevier Patient Education  [place].

## 2022-09-14 NOTE — Assessment & Plan Note (Signed)
Chronic, adequate based on home readings. Continue full dose amlodipine.

## 2022-09-14 NOTE — Assessment & Plan Note (Signed)
Preventative protocols reviewed and updated unless pt declined. Discussed healthy diet and lifestyle.  

## 2022-12-11 ENCOUNTER — Other Ambulatory Visit: Payer: Self-pay | Admitting: *Deleted

## 2022-12-11 DIAGNOSIS — I719 Aortic aneurysm of unspecified site, without rupture: Secondary | ICD-10-CM

## 2022-12-18 ENCOUNTER — Ambulatory Visit (HOSPITAL_COMMUNITY)
Admission: RE | Admit: 2022-12-18 | Discharge: 2022-12-18 | Disposition: A | Discharge status: Home / Self Care | Payer: Medicare HMO | Source: Ambulatory Visit | Attending: Vascular Surgery | Admitting: Vascular Surgery

## 2022-12-18 ENCOUNTER — Ambulatory Visit: Payer: Medicare HMO | Admitting: Physician Assistant

## 2022-12-18 VITALS — BP 142/77 | HR 39 | Temp 98.0°F | Ht 67.5 in | Wt 183.9 lb

## 2022-12-18 DIAGNOSIS — I719 Aortic aneurysm of unspecified site, without rupture: Secondary | ICD-10-CM

## 2022-12-18 NOTE — Progress Notes (Signed)
[facility] [place] HISTORY AND PHYSICAL   History of Present Illness:  Todd Mcmahon is a 67 y.o. year old male who presents for evaluation of abdominal aortic aneurysm/ulcer.  This was found during an [employer] PE for work.  He is asymptomatic for abdominal/back pain.  He denise food fear, claudication, rest pain or non healing ulcers.  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     The pt is not on a statin for cholesterol management.  The pt is not on a daily aspirin.   Other AC:   The pt is on medication for hypertension.   The pt is not diabetic.  Tobacco hx:  current smoker    Past Medical History:  Diagnosis Date   Arthritis    Chronic back pain    COVID-19 virus infection 03/2021   HTN (hypertension)    Smoker 1972    Past Surgical History:  Procedure Laterality Date   NO PAST SURGERIES       Social History Social History   Tobacco Use   Smoking status: Some Days    Packs/day: 0.25    Years: 50.00    Total pack years: 12.50    Types: Cigarettes   Smokeless tobacco: Never  Vaping Use   Vaping Use: Never used  Substance Use Topics   Alcohol use: Yes    Comment: 3-4 beers per day   Drug use: Never    Family History Family History  Problem Relation Age of Onset   Diverticulitis Son    Panic disorder Daughter    Cancer Neg Hx    CAD Neg Hx    Stroke Neg Hx    Diabetes Neg Hx     Allergies  No Known Allergies   Current Outpatient Medications  Medication Sig Dispense Refill   amLODipine (NORVASC) 10 MG tablet Take 1 tablet (10 mg total) by mouth daily. 90 tablet 2   No current facility-administered medications for this visit.    ROS:   General:  No weight loss, Fever, chills  HEENT: No recent headaches, no nasal bleeding, no visual changes, no sore throat  Neurologic: No dizziness, blackouts, seizures. No recent symptoms of stroke or mini- stroke. No recent episodes of slurred speech, or temporary  blindness.  Cardiac: No recent episodes of chest pain/pressure, no shortness of breath at rest.  No shortness of breath with exertion.  Denies history of atrial fibrillation or irregular heartbeat  Vascular: No history of rest pain in feet.  No history of claudication.  No history of non-healing ulcer, No history of DVT   Pulmonary: No home oxygen, no productive cough, no hemoptysis,  No asthma or wheezing  Musculoskeletal:  [ ]  Arthritis, [ ]  Low back pain,  [ ]  Joint pain  Hematologic:No history of hypercoagulable state.  No history of easy bleeding.  No history of anemia  Gastrointestinal: No hematochezia or melena,  No gastroesophageal reflux, no trouble swallowing  Urinary: [ ]  chronic Kidney disease, [ ]  on HD - [ ]  MWF or [ ]  TTHS, [ ]  Burning with urination, [ ]  Frequent urination, [ ]  Difficulty urinating;   Skin: No rashes  Psychological: No history of anxiety,  No history of depression   Physical Examination  Vitals:   12/18/22 0853  BP: (!) 142/77  Pulse: (!) 39  Temp: 98 F (36.7 C)  SpO2: 97%  Weight: 183 lb 14.4 oz (83.4 kg)  Height: 5' 7.5" (1.715 m)  Body mass index is 28.38 kg/m.  General:  Alert and oriented, no acute distress HEENT: Normal Neck: No bruit or JVD Pulmonary: Clear to auscultation bilaterally Cardiac: Regular Rate and Rhythm without murmur Gastrointestinal: Soft, non-tender, non-distended, no mass, no scars Skin: No rash Extremity Pulses:  2+ radial, femoral, dorsalis pedis, posterior tibial pulses bilaterally Musculoskeletal: No deformity or edema  Neurologic: Upper and lower extremity motor 5/5 and symmetric  DATA:      Abdominal Aorta Findings:  +-----------+-------+----------+----------+--------+--------+--------+  Location  AP (cm)Trans (cm)PSV (cm/s)WaveformThrombusComments  +-----------+-------+----------+----------+--------+--------+--------+  Proximal  2.14   2.36      115                                  +-----------+-------+----------+----------+--------+--------+--------+  Mid       1.99   2.00      104                                 +-----------+-------+----------+----------+--------+--------+--------+  Distal    2.09   2.01      105                                 +-----------+-------+----------+----------+--------+--------+--------+  RT CIA Prox0.9    0.7       147                                 +-----------+-------+----------+----------+--------+--------+--------+  LT CIA Prox0.1    0.1       90                                  +-----------+-------+----------+----------+--------+--------+--------+   2.0 x 2.4 mass visualized in the liver.     Summary:  Abdominal Aorta: No evidence of an abdominal aortic aneurysm was  visualized.  Ulcer visualized near the distal aorta measuring approximately 1.04 cm.   NOTE: 2.0 x 2.4 cm mass visualized in the liver.   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  No clinical signs concerning for rupture, no fat stranding, no wall thickening.   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 He has no history of trauma to that region. He has no new onset abdominal pain. Chronic back pain from spondylolisthesis.    Duplex demonstrates no change in the 1 cm ulcer and the aorta is WN size with a largest diameter of 2.36 cm  PLAN:  He will f/u in 6 months.  If there is no change he can be followed yearly.  If he develops sudden sever abdominal or back pain he will call 911.   Roxy Horseman PA-C [facility] [place] Office: [phone]  MD on [employer]

## 2022-12-23 ENCOUNTER — Other Ambulatory Visit: Payer: Self-pay

## 2022-12-23 DIAGNOSIS — I719 Aortic aneurysm of unspecified site, without rupture: Secondary | ICD-10-CM

## 2023-01-19 ENCOUNTER — Other Ambulatory Visit: Payer: Self-pay | Admitting: Family Medicine

## 2023-01-19 ENCOUNTER — Encounter: Payer: Self-pay | Admitting: Family Medicine

## 2023-01-19 DIAGNOSIS — I10 Essential (primary) hypertension: Secondary | ICD-10-CM

## 2023-05-06 ENCOUNTER — Ambulatory Visit
Admission: RE | Admit: 2023-05-06 | Discharge: 2023-05-06 | Disposition: A | Discharge status: Home / Self Care | Payer: Medicare HMO | Source: Ambulatory Visit | Attending: Acute Care | Admitting: Acute Care

## 2023-05-06 DIAGNOSIS — Z87891 Personal history of nicotine dependence: Secondary | ICD-10-CM

## 2023-05-06 DIAGNOSIS — F17210 Nicotine dependence, cigarettes, uncomplicated: Secondary | ICD-10-CM | POA: Diagnosis not present

## 2023-05-06 DIAGNOSIS — Z122 Encounter for screening for malignant neoplasm of respiratory organs: Secondary | ICD-10-CM

## 2023-05-11 ENCOUNTER — Other Ambulatory Visit: Payer: Self-pay | Admitting: Acute Care

## 2023-05-11 DIAGNOSIS — F17210 Nicotine dependence, cigarettes, uncomplicated: Secondary | ICD-10-CM

## 2023-05-11 DIAGNOSIS — Z87891 Personal history of nicotine dependence: Secondary | ICD-10-CM

## 2023-05-11 DIAGNOSIS — Z122 Encounter for screening for malignant neoplasm of respiratory organs: Secondary | ICD-10-CM

## 2023-06-16 NOTE — Progress Notes (Signed)
HISTORY AND PHYSICAL     CC:  follow up. Requesting Provider:  Eustaquio Boyden, MD  HPI: This is a 67 y.o. male who is here today for follow up for AAA that was found during an dannual insurance physical exam for his work.    Pt was last seen 12/18/2022 and at that time, he was asymptomatic.  He did not have AAA but did have small penetrating aortic ulcer.  He was not having any abdominal or back pain.  He did hto have claudication or non healing wounds.  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  He states his blood pressure usually runs in the 120s systolic  The pt returns today for follow up.  He states he is doing well.  No new abdominal or back pain.  He denies any claudication or non healing wounds.  He quit smoking in December.   He did have CT scan for lung cancer screening 05/11/2023 and was negative for that but did have mild increase in ascending aortic dilatation of 4.3cm.    The pt is not on a statin for cholesterol management.    The pt is not on an aspirin.    Other AC:  none The pt is on CCB for hypertension.  The pt is not on medication diabetes. Tobacco hx:  former  Pt does not have family hx of AAA.  Past Medical History:  Diagnosis Date   Arthritis    Chronic back pain    COVID-19 virus infection 03/2021   HTN (hypertension)    Smoker 1972    Past Surgical History:  Procedure Laterality Date   NO PAST SURGERIES      No Known Allergies  Current Outpatient Medications  Medication Sig Dispense Refill   amLODipine (NORVASC) 10 MG tablet TAKE 1 TABLET BY MOUTH EVERY DAY 90 tablet 3   No current facility-administered medications for this visit.    Family History  Problem Relation Age of Onset   Diverticulitis Son    Panic disorder Daughter    Cancer Neg Hx    CAD Neg Hx    Stroke Neg Hx    Diabetes Neg Hx     Social History   Socioeconomic History   Marital status: Married    Spouse name: Not on file   Number of  children: 2   Years of education: Not on file   Highest education level: Not on file  Occupational History   Occupation: Holiday representative  Tobacco Use   Smoking status: Some Days    Current packs/day: 0.25    Average packs/day: 0.3 packs/day for 50.0 years (12.5 ttl pk-yrs)    Types: Cigarettes   Smokeless tobacco: Never  Vaping Use   Vaping status: Never Used  Substance and Sexual Activity   Alcohol use: Yes    Comment: 3-4 beers per day   Drug use: Never   Sexual activity: Not on file  Other Topics Concern   Not on file  Social History Narrative   Lives with wife [name]   Occ: Builder   Edu: HS    Activity: stays active at work   Diet: good water, fruits/vegetables daily   Social Determinants of Health   Financial Resource Strain: Low Risk  (07/28/2022)   Overall Financial Resource Strain (CARDIA)    Difficulty of Paying Living Expenses: Not hard at all  Food Insecurity: No Food Insecurity (07/28/2022)   Hunger Vital Sign    Worried About Running Out  of Food in the Last Year: Never true    Ran Out of Food in the Last Year: Never true  Transportation Needs: No Transportation Needs (07/28/2022)   PRAPARE - Administrator, Civil Service (Medical): No    Lack of Transportation (Non-Medical): No  Physical Activity: Insufficiently Active (07/28/2022)   Exercise Vital Sign    Days of Exercise per Week: 3 days    Minutes of Exercise per Session: 30 min  Stress: No Stress Concern Present (07/28/2022)   [employer] of Occupational Health - Occupational Stress Questionnaire    Feeling of Stress : Only a little  Social Connections: Moderately Isolated (07/28/2022)   Social Connection and Isolation Panel [NHANES]    Frequency of Communication with Friends and Family: More than three times a week    Frequency of Social Gatherings with Friends and Family: Twice a week    Attends Religious Services: Never    Database administrator or Organizations: Not on file    Attends Occupational hygienist Meetings: Never    Marital Status: Married  Catering manager Violence: Not At Risk (07/28/2022)   Humiliation, Afraid, Rape, and Kick questionnaire    Fear of Current or Ex-Partner: No    Emotionally Abused: No    Physically Abused: No    Sexually Abused: No     REVIEW OF SYSTEMS:   [X]  denotes positive finding, [ ]  denotes negative finding Cardiac  Comments:  Chest pain or chest pressure:    Shortness of breath upon exertion:    Short of breath when lying flat:    Irregular heart rhythm:        Vascular    Pain in calf, thigh, or hip brought on by ambulation:    Pain in feet at night that wakes you up from your sleep:     Blood clot in your veins:    Leg swelling:         Pulmonary    Oxygen at home:    Productive cough:     Wheezing:         Neurologic    Sudden weakness in arms or legs:     Sudden numbness in arms or legs:     Sudden onset of difficulty speaking or slurred speech:    Temporary loss of vision in one eye:     Problems with dizziness:         Gastrointestinal    Blood in stool:     Vomited blood:         Genitourinary    Burning when urinating:     Blood in urine:        Psychiatric    Major depression:         Hematologic    Bleeding problems:    Problems with blood clotting too easily:        Skin    Rashes or ulcers:        Constitutional    Fever or chills:      PHYSICAL EXAMINATION:  Today's Vitals   06/18/23 0823  BP: (!) 159/77  Pulse: 84  Resp: 16  Temp: 97.8 F (36.6 C)  TempSrc: Temporal  SpO2: 94%  Weight: 180 lb (81.6 kg)   Body mass index is 27.78 kg/m.   General:  WDWN in NAD; vital signs documented above Gait: Not observed HENT: WNL, normocephalic Pulmonary: normal non-labored breathing  Cardiac: regular HR, without carotid bruits Abdomen: soft, NT;  aortic pulse is palpable Skin: without rashes Vascular Exam/Pulses:  Right Left  Radial 2+ (normal) 2+ (normal)  Popliteal Unable to  palpate Unable to palpate  PT 2+ (normal) 2+ (normal)   Extremities: without ischemic changes, without Gangrene , without cellulitis; without open wounds Musculoskeletal: no muscle wasting or atrophy  Neurologic: A&O X 3;  No focal weakness or paresthesias are detected Psychiatric:  The pt has Normal affect.   Non-Invasive Vascular Imaging:   AAA Arterial duplex on 06/18/2023: Abdominal Aorta Findings:  +-----------+-------+----------+----------+--------+--------+--------+  Location  AP (cm)Trans (cm)PSV (cm/s)WaveformThrombusComments  +-----------+-------+----------+----------+--------+--------+--------+  Proximal  2.75   3.27      85                                  +-----------+-------+----------+----------+--------+--------+--------+  Mid       2.36   2.54      95                                  +-----------+-------+----------+----------+--------+--------+--------+  Distal    2.04   2.20                                          +-----------+-------+----------+----------+--------+--------+--------+  RT CIA Prox1.0    1.2       194                                 +-----------+-------+----------+----------+--------+--------+--------+  RT CIA Mid 1.0    1.2       163                                 +-----------+-------+----------+----------+--------+--------+--------+   Summary:  Abdominal Aorta: There is evidence of abnormal dilatation of the proximal Abdominal aorta. The largest aortic measurement is 3.27 cm. The largest aortic diameter has increased compared to prior exam. Previous diameter measurement was 2.3 cm obtained on 12/18/2022  Previous AAA arterial duplex on 12/18/2022: Summary:  Abdominal Aorta: No evidence of an abdominal aortic aneurysm was visualized.  Ulcer visualized near the distal aorta measuring approximately 1.04 cm.  NOTE: 2.0 x 2.4 cm mass visualized in the liver.    ASSESSMENT/PLAN:: 67 y.o. male here for  follow up.  His duplex did not reveal AAA but did have penetrating aortic ulcer and is back for repeat u/s.   -pt doing well today with palpable PT pulses.  His aortic pulse is palpable but largest diameter of aorta is 3.2cm. -he is not on a statin-he states that his cholesterol labs are good.   -discussed a baby asa, however, he has had issues with GIB in the past.   -pt will f/u in one year with AAA.  He knows to call 911 if he develops sudden severe abdominal or back pain. -fortunately, he quit smoking in December-praised him and strongly encouraged him to continue with cessation.   Doreatha Massed, [facility] [phone]  Clinic MD:   Edilia Bo

## 2023-06-18 ENCOUNTER — Ambulatory Visit: Payer: Medicare HMO | Admitting: Physician Assistant

## 2023-06-18 ENCOUNTER — Ambulatory Visit (HOSPITAL_COMMUNITY)
Admission: RE | Admit: 2023-06-18 | Discharge: 2023-06-18 | Disposition: A | Discharge status: Home / Self Care | Payer: Medicare HMO | Source: Ambulatory Visit | Attending: Vascular Surgery | Admitting: Vascular Surgery

## 2023-06-18 VITALS — BP 159/77 | HR 84 | Temp 97.8°F | Resp 16 | Wt 180.0 lb

## 2023-06-18 DIAGNOSIS — I719 Aortic aneurysm of unspecified site, without rupture: Secondary | ICD-10-CM | POA: Insufficient documentation

## 2023-06-18 DIAGNOSIS — I714 Abdominal aortic aneurysm, without rupture, unspecified: Secondary | ICD-10-CM | POA: Diagnosis not present

## 2023-06-23 ENCOUNTER — Other Ambulatory Visit: Payer: Self-pay

## 2023-06-23 DIAGNOSIS — I714 Abdominal aortic aneurysm, without rupture, unspecified: Secondary | ICD-10-CM

## 2023-07-08 ENCOUNTER — Encounter (INDEPENDENT_AMBULATORY_CARE_PROVIDER_SITE_OTHER): Payer: Self-pay

## 2023-08-02 ENCOUNTER — Telehealth: Payer: Self-pay | Admitting: Family Medicine

## 2023-08-02 ENCOUNTER — Ambulatory Visit (INDEPENDENT_AMBULATORY_CARE_PROVIDER_SITE_OTHER): Payer: Medicare HMO

## 2023-08-02 VITALS — Ht 71.0 in | Wt 186.0 lb

## 2023-08-02 DIAGNOSIS — Z0000 Encounter for general adult medical examination without abnormal findings: Secondary | ICD-10-CM

## 2023-08-02 NOTE — Telephone Encounter (Signed)
Please schedule physical after 09/15/2023 as he will be due at that time. Already had medicare wellness visit

## 2023-08-02 NOTE — Patient Instructions (Signed)
Todd Mcmahon , Thank you for taking time to come for your Medicare Wellness Visit. I appreciate your ongoing commitment to your health goals. Please review the following plan we discussed and let me know if I can assist you in the future.   Referrals/Orders/Follow-Ups/Clinician Recommendations: Aim for 30 minutes of exercise or brisk walking, 6-8 glasses of water, and 5 servings of fruits and vegetables each day.   This is a list of the screening recommended for you and due dates:  Health Maintenance  Topic Date Due   Zoster (Shingles) Vaccine (1 of 2) Never done   Flu Shot  Never done   COVID-19 Vaccine (5 - 2023-24 season) 07/25/2023   Medicare Annual Wellness Visit  07/29/2023   DTaP/Tdap/Td vaccine (2 - Tdap) 11/24/2023   Cologuard (Stool DNA test)  11/07/2024   Pneumonia Vaccine  Completed   Hepatitis C Screening  Completed   HPV Vaccine  Aged Out    Advanced directives: (Copy Requested) Please bring a copy of your health care power of attorney and living will to the office to be added to your chart at your convenience.  Next Medicare Annual Wellness Visit scheduled for next year: Yes

## 2023-08-02 NOTE — Progress Notes (Signed)
Subjective:   Todd Mcmahon is a 67 y.o. male who presents for Medicare Annual/Subsequent preventive examination.  Visit Complete: Virtual  I connected with  Todd Mcmahon on 08/02/23 by a audio enabled telemedicine application and verified that I am speaking with the correct person using two identifiers.  Patient Location: Home  Provider Location: Home Office  I discussed the limitations of evaluation and management by telemedicine. The patient expressed understanding and agreed to proceed.  Patient Medicare AWV questionnaire was completed by the patient on 08/01/23; I have confirmed that all information answered by patient is correct and no changes since this date.  Vital Signs: Because this visit was a virtual/telehealth visit, some criteria may be missing or patient reported. Any vitals not documented were not able to be obtained and vitals that have been documented are patient reported.    Review of Systems      Cardiac Risk Factors include: advanced age (>14men, >32 women);hypertension;smoking/ tobacco exposure;male gender     Objective:    Today's Vitals   08/02/23 1045  Weight: 186 lb (84.4 kg)  Height: 5\' 11"  (1.803 m)   Body mass index is 25.94 kg/m.     08/02/2023   10:52 AM 07/28/2022    1:36 PM  Advanced Directives  Does Patient Have a Medical Advance Directive? Yes No  Type of Estate agent of [place];Living will   Copy of Healthcare Power of Attorney in Chart? No - copy requested   Would patient like information on creating a medical advance directive?  No - Patient declined    Current Medications (verified) Outpatient Encounter Medications as of 08/02/2023  Medication Sig   amLODipine (NORVASC) 10 MG tablet TAKE 1 TABLET BY MOUTH EVERY DAY   No facility-administered encounter medications on file as of 08/02/2023.    Allergies (verified) Patient has no known allergies.   History: Past Medical History:  Diagnosis Date    Arthritis    Chronic back pain    COVID-19 virus infection 03/2021   HTN (hypertension)    Smoker 1972   Past Surgical History:  Procedure Laterality Date   NO PAST SURGERIES     Family History  Problem Relation Age of Onset   Diverticulitis Son    Panic disorder Daughter    Cancer Neg Hx    CAD Neg Hx    Stroke Neg Hx    Diabetes Neg Hx    Social History   Socioeconomic History   Marital status: Married    Spouse name: Not on file   Number of children: 2   Years of education: Not on file   Highest education level: Not on file  Occupational History   Occupation: Holiday representative  Tobacco Use   Smoking status: Some Days    Current packs/day: 0.25    Average packs/day: 0.3 packs/day for 50.0 years (12.5 ttl pk-yrs)    Types: Cigarettes   Smokeless tobacco: Never  Vaping Use   Vaping status: Never Used  Substance and Sexual Activity   Alcohol use: Yes    Comment: 3-4 beers per day   Drug use: Never   Sexual activity: Not on file  Other Topics Concern   Not on file  Social History Narrative   Lives with wife Todd Mcmahon   Occ: Builder   Edu: HS    Activity: stays active at work   Diet: good water, fruits/vegetables daily   Social Determinants of Health   Financial Resource Strain: Low Risk  (  08/01/2023)   Overall Financial Resource Strain (CARDIA)    Difficulty of Paying Living Expenses: Not very hard  Food Insecurity: No Food Insecurity (08/01/2023)   Hunger Vital Sign    Worried About Running Out of Food in the Last Year: Never true    Ran Out of Food in the Last Year: Never true  Transportation Needs: No Transportation Needs (08/01/2023)   PRAPARE - Administrator, Civil Service (Medical): No    Lack of Transportation (Non-Medical): No  Physical Activity: Sufficiently Active (08/01/2023)   Exercise Vital Sign    Days of Exercise per Week: 7 days    Minutes of Exercise per Session: 60 min  Stress: No Stress Concern Present (08/01/2023)   [employer] of  Occupational Health - Occupational Stress Questionnaire    Feeling of Stress : Only a little  Social Connections: Moderately Isolated (08/01/2023)   Social Connection and Isolation Panel [NHANES]    Frequency of Communication with Friends and Family: More than three times a week    Frequency of Social Gatherings with Friends and Family: More than three times a week    Attends Religious Services: Never    Database administrator or Organizations: No    Attends Engineer, structural: Never    Marital Status: Married    Tobacco Counseling Ready to quit: Not Answered Counseling given: Not Answered   Clinical Intake:  Pre-visit preparation completed: Yes  Pain : No/denies pain     BMI - recorded: 25.94 Nutritional Status: BMI 25 -29 Overweight Nutritional Risks: None Diabetes: No  How often do you need to have someone help you when you read instructions, pamphlets, or other written materials from your doctor or pharmacy?: 1 - Never  Interpreter Needed?: No  Information entered by :: C.Jakell Trusty LPN   Activities of Daily Living    08/01/2023    8:56 AM  In your present state of health, do you have any difficulty performing the following activities:  Hearing? 0  Vision? 0  Difficulty concentrating or making decisions? 0  Walking or climbing stairs? 0  Dressing or bathing? 0  Doing errands, shopping? 0  Preparing Food and eating ? N  Using the Toilet? N  In the past six months, have you accidently leaked urine? N  Do you have problems with loss of bowel control? N  Managing your Medications? N  Managing your Finances? N  Housekeeping or managing your Housekeeping? N    Patient Care Team: Eustaquio Boyden, MD as PCP - General (Family Medicine)  Indicate any recent Medical Services you may have received from other than [facility] providers in the past year (date may be approximate).     Assessment:   This is a routine wellness examination for Todd Mcmahon.  Hearing/Vision  screen Hearing Screening - Comments:: Slight hearing loss Vision Screening - Comments:: Glasses - [facility] - UPD on eye exams   Goals Addressed             This Visit's Progress    Patient Stated       Maintain current health status       Depression Screen    08/02/2023   10:48 AM 07/28/2022    1:33 PM 07/25/2021    8:57 AM 01/10/2021   10:43 AM  PHQ 2/9 Scores  PHQ - 2 Score 0 0 0 0  PHQ- 9 Score  0  1    Fall Risk    08/01/2023  8:56 AM 07/28/2022    1:37 PM 07/24/2022    9:59 AM 07/25/2021    8:57 AM  Fall Risk   Falls in the past year? 0 0 0 0  Number falls in past yr: 0 0 0   Injury with Fall? 1 0 0   Risk for fall due to : No Fall Risks No Fall Risks    Follow up Falls prevention discussed;Falls evaluation completed Falls evaluation completed      MEDICARE RISK AT HOME: Medicare Risk at Home Any stairs in or around the home?: Yes If so, are there any without handrails?: No Home free of loose throw rugs in walkways, pet beds, electrical cords, etc?: Yes Adequate lighting in your home to reduce risk of falls?: Yes Life alert?: No Use of a cane, walker or w/c?: No Grab bars in the bathroom?: Yes Shower chair or bench in shower?: No Elevated toilet seat or a handicapped toilet?: No  TIMED UP AND GO:  Was the test performed?  No    Cognitive Function:        08/02/2023   10:52 AM 07/28/2022    1:40 PM  6CIT Screen  What Year? 0 points 0 points  What month? 0 points 0 points  What time? 0 points 0 points  Count back from 20 0 points 0 points  Months in reverse 0 points 0 points  Repeat phrase 0 points 0 points  Total Score 0 points 0 points    Immunizations Immunization History  Administered Date(s) Administered   PFIZER(Purple Top)SARS-COV-2 Vaccination 02/03/2020, 02/28/2020, 10/13/2020, 12/07/2021   PNEUMOCOCCAL CONJUGATE-20 07/25/2021   Td 11/23/2013    TDAP status: Up to date  Flu Vaccine status: Declined, Education has been provided  regarding the importance of this vaccine but patient still declined. Advised may receive this vaccine at local pharmacy or Health Dept. Aware to provide a copy of the vaccination record if obtained from local pharmacy or Health Dept. Verbalized acceptance and understanding.  Pneumococcal vaccine status: Up to date  Covid-19 vaccine status: Declined, Education has been provided regarding the importance of this vaccine but patient still declined. Advised may receive this vaccine at local pharmacy or Health Dept.or vaccine clinic. Aware to provide a copy of the vaccination record if obtained from local pharmacy or Health Dept. Verbalized acceptance and understanding.  Qualifies for Shingles Vaccine? Yes   Zostavax completed No   Shingrix Completed?: No.    Education has been provided regarding the importance of this vaccine. Patient has been advised to call insurance company to determine out of pocket expense if they have not yet received this vaccine. Advised may also receive vaccine at local pharmacy or Health Dept. Verbalized acceptance and understanding.  Screening Tests Health Maintenance  Topic Date Due   Zoster Vaccines- Shingrix (1 of 2) Never done   INFLUENZA VACCINE  Never done   COVID-19 Vaccine (5 - 2023-24 season) 07/25/2023   Medicare Annual Wellness (AWV)  07/29/2023   DTaP/Tdap/Td (2 - Tdap) 11/24/2023   Fecal DNA (Cologuard)  11/07/2024   Pneumonia Vaccine 7+ Years old  Completed   Hepatitis C Screening  Completed   HPV VACCINES  Aged Out    Health Maintenance  Health Maintenance Due  Topic Date Due   Zoster Vaccines- Shingrix (1 of 2) Never done   INFLUENZA VACCINE  Never done   COVID-19 Vaccine (5 - 2023-24 season) 07/25/2023   Medicare Annual Wellness (AWV)  07/29/2023    Colorectal cancer  screening: Type of screening: Cologuard. Completed 11/07/21. Repeat every 3 years  Lung Cancer Screening: (Low Dose CT Chest recommended if Age 65-80 years, 20 pack-year  currently smoking OR have quit w/in 15years.) does qualify.   Lung Cancer Screening Referral:  discuss with PCP  Additional Screening:  Hepatitis C Screening: does qualify; Completed 07/18/21  Vision Screening: Recommended annual ophthalmology exams for early detection of glaucoma and other disorders of the eye. Is the patient up to date with their annual eye exam?  Yes  Who is the provider or what is the name of the office in which the patient attends annual eye exams? [facility] If pt is not established with a provider, would they like to be referred to a provider to establish care? Yes .   Dental Screening: Recommended annual dental exams for proper oral hygiene  Diabetic Foot Exam:   Community Resource Referral / Chronic Care Management: CRR required this visit?  No   CCM required this visit?  No     Plan:     I have personally reviewed and noted the following in the patient's chart:   Medical and social history Use of alcohol, tobacco or illicit drugs  Current medications and supplements including opioid prescriptions. Patient is not currently taking opioid prescriptions. Functional ability and status Nutritional status Physical activity Advanced directives List of other physicians Hospitalizations, surgeries, and ER visits in previous 12 months Vitals Screenings to include cognitive, depression, and falls Referrals and appointments  In addition, I have reviewed and discussed with patient certain preventive protocols, quality metrics, and best practice recommendations. A written personalized care plan for preventive services as well as general preventive health recommendations were provided to patient.     Maryan Puls, LPN   11/28/1094   After Visit Summary: (MyChart) Due to this being a telephonic visit, the after visit summary with patients personalized plan was offered to patient via MyChart   Nurse Notes: none

## 2023-08-02 NOTE — Telephone Encounter (Signed)
Spoke to pt, scheduled cpe for 10/08/23

## 2023-09-26 ENCOUNTER — Other Ambulatory Visit: Payer: Self-pay | Admitting: Family Medicine

## 2023-09-26 DIAGNOSIS — E041 Nontoxic single thyroid nodule: Secondary | ICD-10-CM

## 2023-09-26 DIAGNOSIS — D563 Thalassemia minor: Secondary | ICD-10-CM

## 2023-09-26 DIAGNOSIS — Z125 Encounter for screening for malignant neoplasm of prostate: Secondary | ICD-10-CM

## 2023-09-26 DIAGNOSIS — I10 Essential (primary) hypertension: Secondary | ICD-10-CM

## 2023-09-26 DIAGNOSIS — R739 Hyperglycemia, unspecified: Secondary | ICD-10-CM

## 2023-09-28 ENCOUNTER — Other Ambulatory Visit (INDEPENDENT_AMBULATORY_CARE_PROVIDER_SITE_OTHER): Payer: Medicare HMO

## 2023-09-28 DIAGNOSIS — R739 Hyperglycemia, unspecified: Secondary | ICD-10-CM | POA: Diagnosis not present

## 2023-09-28 DIAGNOSIS — E041 Nontoxic single thyroid nodule: Secondary | ICD-10-CM | POA: Diagnosis not present

## 2023-09-28 DIAGNOSIS — Z125 Encounter for screening for malignant neoplasm of prostate: Secondary | ICD-10-CM | POA: Diagnosis not present

## 2023-09-28 DIAGNOSIS — I10 Essential (primary) hypertension: Secondary | ICD-10-CM | POA: Diagnosis not present

## 2023-09-28 DIAGNOSIS — D563 Thalassemia minor: Secondary | ICD-10-CM

## 2023-09-28 LAB — COMPREHENSIVE METABOLIC PANEL
ALT: 82 U/L — ABNORMAL HIGH (ref 0–53)
AST: 128 U/L — ABNORMAL HIGH (ref 0–37)
Albumin: 3.8 g/dL (ref 3.5–5.2)
Alkaline Phosphatase: 68 U/L (ref 39–117)
BUN: 10 mg/dL (ref 6–23)
CO2: 28 meq/L (ref 19–32)
Calcium: 9 mg/dL (ref 8.4–10.5)
Chloride: 101 meq/L (ref 96–112)
Creatinine, Ser: 0.7 mg/dL (ref 0.40–1.50)
GFR: 95.11 mL/min (ref >60.00)
Glucose, Bld: 100 mg/dL — ABNORMAL HIGH (ref 70–99)
Potassium: 3.8 meq/L (ref 3.5–5.1)
Sodium: 139 meq/L (ref 135–145)
Total Bilirubin: 0.8 mg/dL (ref 0.2–1.2)
Total Protein: 6.4 g/dL (ref 6.0–8.3)

## 2023-09-28 LAB — HEMOGLOBIN A1C: Hgb A1c MFr Bld: 5.1 % (ref 4.6–6.5)

## 2023-09-28 LAB — LIPID PANEL
Cholesterol: 177 mg/dL (ref 0–200)
HDL: 69 mg/dL (ref >39.00)
LDL Cholesterol: 73 mg/dL (ref 0–99)
NonHDL: 107.76
Total CHOL/HDL Ratio: 3
Triglycerides: 176 mg/dL — ABNORMAL HIGH (ref 0.0–149.0)
VLDL: 35.2 mg/dL (ref 0.0–40.0)

## 2023-09-28 LAB — MICROALBUMIN / CREATININE URINE RATIO
Creatinine,U: 282.5 mg/dL
Microalb Creat Ratio: 0.9 mg/g (ref 0.0–30.0)
Microalb, Ur: 2.4 mg/dL — ABNORMAL HIGH (ref 0.0–1.9)

## 2023-09-28 LAB — CBC WITH DIFFERENTIAL/PLATELET
Basophils Absolute: 0 10*3/uL (ref 0.0–0.1)
Basophils Relative: 0.5 % (ref 0.0–3.0)
Eosinophils Absolute: 0.1 10*3/uL (ref 0.0–0.7)
Eosinophils Relative: 1.2 % (ref 0.0–5.0)
HCT: 38.6 % — ABNORMAL LOW (ref 39.0–52.0)
Hemoglobin: 11.8 g/dL — ABNORMAL LOW (ref 13.0–17.0)
Lymphocytes Relative: 30.3 % (ref 12.0–46.0)
Lymphs Abs: 2.4 10*3/uL (ref 0.7–4.0)
MCHC: 30.6 g/dL (ref 30.0–36.0)
MCV: 75.4 fL — ABNORMAL LOW (ref 78.0–100.0)
Monocytes Absolute: 0.8 10*3/uL (ref 0.1–1.0)
Monocytes Relative: 9.5 % (ref 3.0–12.0)
Neutro Abs: 4.7 10*3/uL (ref 1.4–7.7)
Neutrophils Relative %: 58.5 % (ref 43.0–77.0)
Platelets: 276 10*3/uL (ref 150.0–400.0)
RBC: 5.11 Mil/uL (ref 4.22–5.81)
RDW: 17 % — ABNORMAL HIGH (ref 11.5–15.5)
WBC: 8 10*3/uL (ref 4.0–10.5)

## 2023-09-28 LAB — TSH: TSH: 0.45 u[IU]/mL (ref 0.35–5.50)

## 2023-09-28 LAB — PSA: PSA: 3.43 ng/mL (ref 0.10–4.00)

## 2023-09-28 LAB — IBC PANEL
Iron: 201 ug/dL — ABNORMAL HIGH (ref 42–165)
Saturation Ratios: 70 % — ABNORMAL HIGH (ref 20.0–50.0)
TIBC: 287 ug/dL (ref 250.0–450.0)
Transferrin: 205 mg/dL — ABNORMAL LOW (ref 212.0–360.0)

## 2023-10-08 ENCOUNTER — Ambulatory Visit: Payer: Medicare HMO | Admitting: Family Medicine

## 2023-10-08 ENCOUNTER — Encounter: Payer: Self-pay | Admitting: Family Medicine

## 2023-10-08 VITALS — BP 158/80 | HR 95 | Temp 98.8°F | Ht 67.5 in | Wt 193.0 lb

## 2023-10-08 DIAGNOSIS — D563 Thalassemia minor: Secondary | ICD-10-CM

## 2023-10-08 DIAGNOSIS — R7401 Elevation of levels of liver transaminase levels: Secondary | ICD-10-CM

## 2023-10-08 DIAGNOSIS — K4090 Unilateral inguinal hernia, without obstruction or gangrene, not specified as recurrent: Secondary | ICD-10-CM | POA: Diagnosis not present

## 2023-10-08 DIAGNOSIS — K760 Fatty (change of) liver, not elsewhere classified: Secondary | ICD-10-CM

## 2023-10-08 DIAGNOSIS — E663 Overweight: Secondary | ICD-10-CM

## 2023-10-08 DIAGNOSIS — Z0000 Encounter for general adult medical examination without abnormal findings: Secondary | ICD-10-CM

## 2023-10-08 DIAGNOSIS — I499 Cardiac arrhythmia, unspecified: Secondary | ICD-10-CM | POA: Diagnosis not present

## 2023-10-08 DIAGNOSIS — I719 Aortic aneurysm of unspecified site, without rupture: Secondary | ICD-10-CM

## 2023-10-08 DIAGNOSIS — I700 Atherosclerosis of aorta: Secondary | ICD-10-CM

## 2023-10-08 DIAGNOSIS — I10 Essential (primary) hypertension: Secondary | ICD-10-CM

## 2023-10-08 DIAGNOSIS — Z87891 Personal history of nicotine dependence: Secondary | ICD-10-CM

## 2023-10-08 DIAGNOSIS — Z7189 Other specified counseling: Secondary | ICD-10-CM

## 2023-10-08 DIAGNOSIS — G4700 Insomnia, unspecified: Secondary | ICD-10-CM | POA: Diagnosis not present

## 2023-10-08 DIAGNOSIS — I7121 Aneurysm of the ascending aorta, without rupture: Secondary | ICD-10-CM

## 2023-10-08 NOTE — Assessment & Plan Note (Signed)
Discussed weight gain noted.  Back off boost supplement.

## 2023-10-08 NOTE — Assessment & Plan Note (Signed)
H/o this by lung cancer screening CT Update RUQ [place].

## 2023-10-08 NOTE — Assessment & Plan Note (Signed)
Newly noted - in h/o previous iron deficiency, pointing against hemochromatosis.  He has h/o beta thalassemia by Hgb electrophoresis.  No oral iron intake. No recent transfusion.  Repeat labs at 2-3 mo f/u lab visit

## 2023-10-08 NOTE — Patient Instructions (Addendum)
EKG today  If interested, check with pharmacy about new 2 shot shingles series (shingrix).  Cut down on alcohol. I will order liver ultrasound at [facility].  Return in 2-3 months for lab visit only.  Return in 4 months for follow up visit.

## 2023-10-08 NOTE — Assessment & Plan Note (Signed)
Advanced directive discussion - discussed. Has this at home. Wife Coralyn Martise would be HCPOA. Asked to bring [place] copy

## 2023-10-08 NOTE — Assessment & Plan Note (Addendum)
EKG today with frequent PVCs, no afib.

## 2023-10-08 NOTE — Assessment & Plan Note (Signed)
Appreciate [facility] care - sees yearly.

## 2023-10-08 NOTE — Assessment & Plan Note (Signed)
Congratulated on full smoking cessation!  He has established with lung cancer screening program.

## 2023-10-08 NOTE — Progress Notes (Signed)
Ph: [phone] Fax: [phone]   Patient ID: Todd Mcmahon, male    DOB: 1956-02-09, 67 y.o.   MRN: [id number]  This visit was conducted in person.  BP (!) 158/80 (BP Location: Right Arm, Cuff Size: Large)   Pulse 95   Temp 98.8 F (37.1 C) (Oral)   Ht 5' 7.5" (1.715 m)   Wt 193 lb (87.5 kg)   SpO2 97%   BMI 29.78 kg/m   BP Readings from Last 3 Encounters:  10/08/23 (!) 158/80  06/18/23 (!) 159/77  12/18/22 (!) 142/77   CC: CPE Subjective:   HPI: Todd Mcmahon is a 67 y.o. male presenting on 10/08/2023 for Annual Exam (MCR prt 2 [AWV- 08/02/23].)   Saw health advisor 07/2023 for medicare wellness visit. Note reviewed.   No results found.  Flowsheet Row Clinical Support from 08/02/2023 in [facility] [place]  PHQ-2 Total Score 0          08/01/2023    8:56 AM 07/28/2022    1:37 PM 07/24/2022    9:59 AM 07/25/2021    8:57 AM  Fall Risk   Falls in the past year? 0 0 0 0  Number falls in past yr: 0 0 0   Injury with Fall? 1 0 0   Risk for fall due to : No Fall Risks No Fall Risks    Follow up Falls prevention discussed;Falls evaluation completed Falls evaluation completed      HTN - continues amlodipine 10mg  daily - takes at night time.  This time of day BP runs up to 160s, then rest of day it stays arould 130s systolic. Drinking 12-16 oz coffee at 7am. Working either in am or closing hours at [employer].   Continued sleep initiation and maintenance insomnia.  Bedtime 11pm - wakes up at 4am.  Sleep latency - 15-71min.  Has tried gummy melatonin without benefit, shot of whiskey.   H/o penetrating aortic ulcer followed regularly by [facility] with yearly AAA ultrasound.   He is drinking boost daily.   Preventative: Colon cancer screening - cologuard WNL 10/2021 Prostate cancer screening - no nocturia, denies significant prostate symptoms. Yearly PSA.  Lung cancer screening - ~25 PY hx. Started screening CT program 09/2021.  Flu shot  - declines as he gets sick x3d each time he gets it.  COVID vaccine - Pfizer 01/2020, 02/2020, booster 09/2020, bivalent 11/2021 Tetanus shot - thinks ~2015  Prevnar-20 07/2021  Shingrix - to check with pharmacy due to insurance  Advanced directive discussion - discussed. Has this at home. Wife [name] would be HCPOA. Asked to bring [place] copy  Seat belt use discussed Sunscreen use discussed. No changing moles on skin. Sleep - 4 hours on average - see above Smoking - fully quit 09/2022!  Alcohol - 3-4 drinks socially otherwise 1-2 drinks intermittently Dentist - due Eye exam - yearly Bowel - no constipation  Bladder - no incontinence   Lives with wife [name]  Occ: Builder  Edu: HS  Activity: stays active at work  Diet: good water, fruits/vegetables daily      Relevant past medical, surgical, family and social history reviewed and updated as indicated. Interim medical history since our last visit reviewed. Allergies and medications reviewed and updated. Outpatient Medications Prior to Visit  Medication Sig Dispense Refill   amLODipine (NORVASC) 10 MG tablet TAKE 1 TABLET BY MOUTH EVERY DAY 90 tablet 3   No facility-administered medications prior to visit.  Per HPI unless specifically indicated in ROS section below Review of Systems  Constitutional:  Positive for fever (COVID+ 6 wks ago). Negative for activity change, appetite change, chills, fatigue and unexpected weight change.  HENT:  Positive for congestion (recet). Negative for hearing loss.   Eyes:  Negative for visual disturbance.  Respiratory:  Negative for cough, chest tightness, shortness of breath and wheezing.   Cardiovascular:  Negative for chest pain, palpitations and leg swelling.  Gastrointestinal:  Negative for abdominal distention, abdominal pain, blood in stool, constipation, diarrhea, nausea and vomiting.  Genitourinary:  Negative for difficulty urinating and hematuria.  Musculoskeletal:  Negative for arthralgias,  myalgias and neck pain.  Skin:  Negative for rash.  Neurological:  Negative for dizziness, seizures, syncope and headaches.  Hematological:  Negative for adenopathy. Does not bruise/bleed easily.  Psychiatric/Behavioral:  Negative for dysphoric mood. The patient is not nervous/anxious.     Objective:  BP (!) 158/80 (BP Location: Right Arm, Cuff Size: Large)   Pulse 95   Temp 98.8 F (37.1 C) (Oral)   Ht 5' 7.5" (1.715 m)   Wt 193 lb (87.5 kg)   SpO2 97%   BMI 29.78 kg/m   Wt Readings from Last 3 Encounters:  10/08/23 193 lb (87.5 kg)  08/02/23 186 lb (84.4 kg)  06/18/23 180 lb (81.6 kg)      Physical Exam Vitals and nursing note reviewed.  Constitutional:      General: He is not in acute distress.    Appearance: Normal appearance. He is well-developed. He is not ill-appearing.  HENT:     Head: Normocephalic and atraumatic.     Right Ear: Hearing, tympanic membrane, ear canal and external ear normal.     Left Ear: Hearing, tympanic membrane, ear canal and external ear normal.     Mouth/Throat:     Mouth: Mucous membranes are moist.     Pharynx: Oropharynx is clear. No oropharyngeal exudate or posterior oropharyngeal erythema.  Eyes:     General: No scleral icterus.    Extraocular Movements: Extraocular movements intact.     Conjunctiva/sclera: Conjunctivae normal.     Pupils: Pupils are equal, round, and reactive to light.  Neck:     Thyroid: No thyroid mass or thyromegaly.     Vascular: No carotid bruit.  Cardiovascular:     Rate and Rhythm: Normal rate and regular rhythm.     Pulses: Normal pulses.          Radial pulses are 2+ on the right side and 2+ on the left side.     Heart sounds: Normal heart sounds. No murmur heard. Pulmonary:     Effort: Pulmonary effort is normal. No respiratory distress.     Breath sounds: Normal breath sounds. No wheezing, rhonchi or rales.  Abdominal:     General: Bowel sounds are normal. There is no distension.     Palpations:  Abdomen is soft. There is no mass.     Tenderness: There is no abdominal tenderness. There is no guarding or rebound.     Hernia: No hernia is present.  Musculoskeletal:        General: Normal range of motion.     Cervical back: Normal range of motion and neck supple.     Right lower leg: No edema.     Left lower leg: No edema.  Lymphadenopathy:     Cervical: No cervical adenopathy.  Skin:    General: Skin is warm and dry.  Findings: No rash.  Neurological:     General: No focal deficit present.     Mental Status: He is alert and oriented to person, place, and time.  Psychiatric:        Mood and Affect: Mood normal.        Behavior: Behavior normal.        Thought Content: Thought content normal.        Judgment: Judgment normal.       Results for orders placed or performed in visit on 09/28/23  IBC panel  Result Value Ref Range   Iron 201 (H) 42 - 165 ug/dL   Transferrin 829.5 (L) 212.0 - 360.0 mg/dL   Saturation Ratios 62.1 (H) 20.0 - 50.0 %   TIBC 287.0 250.0 - 450.0 mcg/dL  Microalbumin / creatinine urine ratio  Result Value Ref Range   Microalb, Ur 2.4 (H) 0.0 - 1.9 mg/dL   Creatinine,U 308.6 mg/dL   Microalb Creat Ratio 0.9 0.0 - 30.0 mg/g  Comprehensive metabolic panel  Result Value Ref Range   Sodium 139 135 - 145 mEq/L   Potassium 3.8 3.5 - 5.1 mEq/L   Chloride 101 96 - 112 mEq/L   CO2 28 19 - 32 mEq/L   Glucose, Bld 100 (H) 70 - 99 mg/dL   BUN 10 6 - 23 mg/dL   Creatinine, Ser 5.78 0.40 - 1.50 mg/dL   Total Bilirubin 0.8 0.2 - 1.2 mg/dL   Alkaline Phosphatase 68 39 - 117 U/L   AST 128 (H) 0 - 37 U/L   ALT 82 (H) 0 - 53 U/L   Total Protein 6.4 6.0 - 8.3 g/dL   Albumin 3.8 3.5 - 5.2 g/dL   GFR 46.96 >29.52 mL/min   Calcium 9.0 8.4 - 10.5 mg/dL  Lipid panel  Result Value Ref Range   Cholesterol 177 0 - 200 mg/dL   Triglycerides 841.3 (H) 0.0 - 149.0 mg/dL   HDL 24.40 >10.27 mg/dL   VLDL 25.3 0.0 - 66.4 mg/dL   LDL Cholesterol 73 0 - 99 mg/dL    Total CHOL/HDL Ratio 3    NonHDL 107.76   CBC with Differential/Platelet  Result Value Ref Range   WBC 8.0 4.0 - 10.5 K/uL   RBC 5.11 4.22 - 5.81 Mil/uL   Hemoglobin 11.8 (L) 13.0 - 17.0 g/dL   HCT 40.3 (L) 47.4 - 25.9 %   MCV 75.4 (L) 78.0 - 100.0 fl   MCHC 30.6 30.0 - 36.0 g/dL   RDW 56.3 (H) 87.5 - 64.3 %   Platelets 276.0 150.0 - 400.0 K/uL   Neutrophils Relative % 58.5 43.0 - 77.0 %   Lymphocytes Relative 30.3 12.0 - 46.0 %   Monocytes Relative 9.5 3.0 - 12.0 %   Eosinophils Relative 1.2 0.0 - 5.0 %   Basophils Relative 0.5 0.0 - 3.0 %   Neutro Abs 4.7 1.4 - 7.7 K/uL   Lymphs Abs 2.4 0.7 - 4.0 K/uL   Monocytes Absolute 0.8 0.1 - 1.0 K/uL   Eosinophils Absolute 0.1 0.0 - 0.7 K/uL   Basophils Absolute 0.0 0.0 - 0.1 K/uL  TSH  Result Value Ref Range   TSH 0.45 0.35 - 5.50 uIU/mL  Hemoglobin A1c  Result Value Ref Range   Hgb A1c MFr Bld 5.1 4.6 - 6.5 %  PSA  Result Value Ref Range   PSA 3.43 0.10 - 4.00 ng/mL   EKG - sinus rhythm at rate of 90 with frequent PVCs, normal axis,  intervals, no hypertrophy or acute ST/T changes   Assessment & Plan:   Problem List Items Addressed This Visit     Advanced directives, counseling/discussion (Chronic)    Advanced directive discussion - discussed. Has this at home. Wife [name] would be HCPOA. Asked to bring [place] copy       Health maintenance examination - Primary (Chronic)    Preventative protocols reviewed and updated unless pt declined. Discussed healthy diet and lifestyle.       Ex-smoker    Congratulated on full smoking cessation!  He has established with lung cancer screening program.       Hypertension    Chronic, predominantly well controlled at home (120/80s) however BP seems to spike up around mid morning regardless of when he takes his amlodipine, doesn't seem activity or caffeine related.  Consider antihypertensive titration pending more home readings.       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  Still not ready for surgical evaluation, concerned about time out of work. Aware of red flags to seek ER care.       Penetrating atherosclerotic ulcer of aorta (HCC)    Appreciate [facility] care - sees yearly.       Atherosclerosis of aorta (HCC)    FLP stable. Not on statin.       Thoracic ascending aortic aneurysm (HCC)    4.3cm on latest lung CT - continue monitoring on yearly CT scan.       Overweight with body mass index (BMI) 25.0-29.9    Discussed weight gain noted.  Back off boost supplement.       Insomnia    Ongoing sleep initiation and maintenance insomnia. Melatonin not very effective. Consider trazodone vs other med.       Transaminitis    New, mild, AST predominant with hepatomegaly without splenomegaly on exam.  Encouraged cut down on alcohol.  Check RUQ [place]. Rpt labs in 3 months.  Consider amlodipine.       Relevant Orders   US ABDOMEN LIMITED RUQ (LIVER/GB)   Hepatic function panel   Ferritin   IBC panel   Gamma GT   Fatty liver    H/o this by lung cancer screening CT Update RUQ [place].       Relevant Orders   US ABDOMEN LIMITED RUQ (LIVER/GB)   Irregular heart rhythm    EKG today with frequent PVCs, no afib.       Relevant Orders   EKG 12-Lead (Completed)   Iron overload    Newly noted - in h/o previous iron deficiency, pointing against hemochromatosis.  He has h/o beta thalassemia by Hgb electrophoresis.  No oral iron intake. No recent transfusion.  Repeat labs at 2-3 mo f/u lab visit       Relevant Orders   CBC with Differential/Platelet   Hepatic function panel   Ferritin   IBC panel     No orders of the defined types were placed in this encounter.   Orders Placed This Encounter  Procedures   US ABDOMEN LIMITED RUQ (LIVER/GB)    Standing Status:   Future    Standing Expiration Date:   10/07/2024    Order Specific Question:   Reason for Exam (SYMPTOM  OR DIAGNOSIS REQUIRED)     Answer:   transaminitis with fatty liver, alcohol use    Order Specific Question:   Preferred imaging location?    Answer:  [facility]   CBC with Differential/Platelet    Standing Status:   Future    Standing Expiration Date:   10/07/2024   Hepatic function panel    Standing Status:   Future    Standing Expiration Date:   10/07/2024   Ferritin    Standing Status:   Future    Standing Expiration Date:   10/07/2024   IBC panel    Standing Status:   Future    Standing Expiration Date:   10/07/2024   Gamma GT    Standing Status:   Future    Standing Expiration Date:   10/07/2024   EKG 12-Lead    Patient Instructions  EKG today  If interested, check with pharmacy about new 2 shot shingles series (shingrix).  Cut down on alcohol. I will order liver ultrasound at [facility].  Return in 2-3 months for lab visit only.  Return in 4 months for follow up visit.   Follow up plan: Return in about 4 months (around 02/05/2024) for follow up visit.  Eustaquio Boyden, MD

## 2023-10-08 NOTE — Assessment & Plan Note (Addendum)
Chronic, predominantly well controlled at home (120/80s) however BP seems to spike up around mid morning regardless of when he takes his amlodipine, doesn't seem activity or caffeine related.  Consider antihypertensive titration pending more home readings.

## 2023-10-08 NOTE — Assessment & Plan Note (Signed)
New, mild, AST predominant with hepatomegaly without splenomegaly on exam.  Encouraged cut down on alcohol.  Check RUQ [place]. Rpt labs in 3 months.  Consider amlodipine.

## 2023-10-08 NOTE — Assessment & Plan Note (Signed)
Chronic, longstanding.  Still not ready for surgical evaluation, concerned about time out of work. Aware of red flags to seek ER care.

## 2023-10-08 NOTE — Assessment & Plan Note (Signed)
FLP stable. Not on statin.

## 2023-10-08 NOTE — Assessment & Plan Note (Signed)
4.3cm on latest lung CT - continue monitoring on yearly CT scan.

## 2023-10-08 NOTE — Assessment & Plan Note (Signed)
Preventative protocols reviewed and updated unless pt declined. Discussed healthy diet and lifestyle.  

## 2023-10-08 NOTE — Assessment & Plan Note (Signed)
Ongoing sleep initiation and maintenance insomnia. Melatonin not very effective. Consider trazodone vs other med.

## 2023-10-19 ENCOUNTER — Ambulatory Visit
Admission: RE | Admit: 2023-10-19 | Discharge: 2023-10-19 | Disposition: A | Discharge status: Home / Self Care | Payer: Medicare HMO | Source: Ambulatory Visit | Attending: Family Medicine | Admitting: Family Medicine

## 2023-10-19 ENCOUNTER — Telehealth: Payer: Self-pay

## 2023-10-19 DIAGNOSIS — K760 Fatty (change of) liver, not elsewhere classified: Secondary | ICD-10-CM | POA: Diagnosis not present

## 2023-10-19 DIAGNOSIS — K769 Liver disease, unspecified: Secondary | ICD-10-CM | POA: Diagnosis not present

## 2023-10-19 DIAGNOSIS — R7401 Elevation of levels of liver transaminase levels: Secondary | ICD-10-CM

## 2023-10-19 DIAGNOSIS — R160 Hepatomegaly, not elsewhere classified: Secondary | ICD-10-CM

## 2023-10-19 DIAGNOSIS — K824 Cholesterolosis of gallbladder: Secondary | ICD-10-CM | POA: Diagnosis not present

## 2023-10-19 NOTE — Telephone Encounter (Signed)
Tifffany with [place] radiology called report on US abdomen. Report is in Epic and copy of report taken to Dr Sharen Hones. Sending note to Dr Sharen Hones.

## 2023-10-20 NOTE — Telephone Encounter (Addendum)
Already addressed - see result note.  He agrees to proceed with abd MRI with and without contrast.

## 2023-10-20 NOTE — Addendum Note (Signed)
Addended by: Eustaquio Boyden on: 10/20/2023 05:16 PM   Modules accepted: Orders

## 2023-11-18 ENCOUNTER — Ambulatory Visit
Admission: RE | Admit: 2023-11-18 | Discharge: 2023-11-18 | Disposition: A | Discharge status: Home / Self Care | Payer: Medicare HMO | Source: Ambulatory Visit | Attending: Family Medicine | Admitting: Family Medicine

## 2023-11-18 DIAGNOSIS — K769 Liver disease, unspecified: Secondary | ICD-10-CM

## 2023-11-18 DIAGNOSIS — K7689 Other specified diseases of liver: Secondary | ICD-10-CM | POA: Diagnosis not present

## 2023-11-18 MED ORDER — GADOPICLENOL 0.5 MMOL/ML IV SOLN
8.0000 mL | Freq: Once | INTRAVENOUS | Status: AC | PRN
  Administered 2023-11-18: 8 mL via INTRAVENOUS

## 2024-01-22 ENCOUNTER — Other Ambulatory Visit: Payer: Self-pay | Admitting: Family Medicine

## 2024-01-22 DIAGNOSIS — I10 Essential (primary) hypertension: Secondary | ICD-10-CM

## 2024-05-17 ENCOUNTER — Other Ambulatory Visit: Payer: Self-pay | Admitting: Acute Care

## 2024-05-17 DIAGNOSIS — Z122 Encounter for screening for malignant neoplasm of respiratory organs: Secondary | ICD-10-CM

## 2024-05-17 DIAGNOSIS — F17210 Nicotine dependence, cigarettes, uncomplicated: Secondary | ICD-10-CM

## 2024-05-17 DIAGNOSIS — Z87891 Personal history of nicotine dependence: Secondary | ICD-10-CM

## 2024-06-02 ENCOUNTER — Ambulatory Visit
Admission: RE | Admit: 2024-06-02 | Discharge: 2024-06-02 | Disposition: A | Discharge status: Home / Self Care | Source: Ambulatory Visit | Attending: Acute Care | Admitting: Acute Care

## 2024-06-02 DIAGNOSIS — F17210 Nicotine dependence, cigarettes, uncomplicated: Secondary | ICD-10-CM | POA: Diagnosis not present

## 2024-06-02 DIAGNOSIS — Z87891 Personal history of nicotine dependence: Secondary | ICD-10-CM

## 2024-06-02 DIAGNOSIS — Z122 Encounter for screening for malignant neoplasm of respiratory organs: Secondary | ICD-10-CM | POA: Diagnosis not present

## 2024-06-12 ENCOUNTER — Other Ambulatory Visit: Payer: Self-pay | Admitting: Acute Care

## 2024-06-12 DIAGNOSIS — Z87891 Personal history of nicotine dependence: Secondary | ICD-10-CM

## 2024-06-12 DIAGNOSIS — Z122 Encounter for screening for malignant neoplasm of respiratory organs: Secondary | ICD-10-CM

## 2024-06-12 DIAGNOSIS — F17210 Nicotine dependence, cigarettes, uncomplicated: Secondary | ICD-10-CM

## 2024-06-19 ENCOUNTER — Other Ambulatory Visit: Payer: Self-pay | Admitting: Vascular Surgery

## 2024-06-19 DIAGNOSIS — I714 Abdominal aortic aneurysm, without rupture, unspecified: Secondary | ICD-10-CM

## 2024-08-11 ENCOUNTER — Ambulatory Visit: Payer: Medicare HMO

## 2024-08-11 VITALS — Ht 67.5 in | Wt 193.0 lb

## 2024-08-11 DIAGNOSIS — Z0000 Encounter for general adult medical examination without abnormal findings: Secondary | ICD-10-CM | POA: Diagnosis not present

## 2024-08-11 NOTE — Progress Notes (Signed)
 Subjective:   Todd Mcmahon is a 68 y.o. who presents for a Medicare Wellness preventive visit.  As a reminder, Annual Wellness Visits don't include a physical exam, and some assessments may be limited, especially if this visit is performed virtually. We may recommend an in-person follow-up visit with your provider if needed.  Visit Complete: Virtual I connected with  Todd Mcmahon on 08/11/24 by a audio enabled telemedicine application and verified that I am speaking with the correct person using two identifiers.  Patient Location: Home  Provider Location: Office/Clinic  I discussed the limitations of evaluation and management by telemedicine. The patient expressed understanding and agreed to proceed.  Vital Signs: Because this visit was a virtual/telehealth visit, some criteria may be missing or patient reported. Any vitals not documented were not able to be obtained and vitals that have been documented are patient reported.  VideoDeclined- This patient declined Librarian, academic. Therefore the visit was completed with audio only.  Persons Participating in Visit: Patient.  AWV Questionnaire: Yes: Patient Medicare AWV questionnaire was completed by the patient on 08/07/24; I have confirmed that all information answered by patient is correct and no changes since this date.  Cardiac Risk Factors include: advanced age (>81men, >90 women);hypertension;male gender     Objective:    Today's Vitals   08/11/24 1021  Weight: 193 lb (87.5 kg)  Height: 5' 7.5 (1.715 m)   Body mass index is 29.78 kg/m.     08/11/2024   10:30 AM 08/02/2023   10:52 AM 07/28/2022    1:36 PM  Advanced Directives  Does Patient Have a Medical Advance Directive? Yes Yes No  Type of Estate agent of [place];Living will Healthcare Power of [place];Living will   Copy of Healthcare Power of Attorney in Chart? No - copy requested No - copy requested    Would patient like information on creating a medical advance directive?   No - Patient declined    Current Medications (verified) Outpatient Encounter Medications as of 08/11/2024  Medication Sig   amLODipine  (NORVASC ) 10 MG tablet TAKE 1 TABLET BY MOUTH EVERY DAY   No facility-administered encounter medications on file as of 08/11/2024.    Allergies (verified) Patient has no known allergies.   History: Past Medical History:  Diagnosis Date   Arthritis    Chronic back pain    COVID-19 virus infection 03/2021   HTN (hypertension)    Smoker 1972   Past Surgical History:  Procedure Laterality Date   NO PAST SURGERIES     Family History  Problem Relation Age of Onset   Diverticulitis Son    Panic disorder Daughter    Cancer Neg Hx    CAD Neg Hx    Stroke Neg Hx    Diabetes Neg Hx    Social History   Socioeconomic History   Marital status: Married    Spouse name: Not on file   Number of children: 2   Years of education: Not on file   Highest education level: 12th grade  Occupational History   Occupation: Holiday representative  Tobacco Use   Smoking status: Former    Average packs/day: 0.3 packs/day for 50.0 years (12.5 ttl pk-yrs)    Types: Cigarettes    Start date: 08/23/2022    Quit date: 1973    Years since quitting: 52.7   Smokeless tobacco: Never  Vaping Use   Vaping status: Never Used  Substance and Sexual Activity   Alcohol  use: Yes    Comment: 3-4 beers per day   Drug use: Never   Sexual activity: Not on file  Other Topics Concern   Not on file  Social History Narrative   Lives with wife Jerel   Occ: Builder   Edu: HS    Activity: stays active at work   Diet: good water, fruits/vegetables daily   Social Drivers of Corporate investment banker Strain: Low Risk  (08/07/2024)   Overall Financial Resource Strain (CARDIA)    Difficulty of Paying Living Expenses: Not very hard  Food Insecurity: No Food Insecurity (08/07/2024)   Hunger Vital Sign    Worried  About Running Out of Food in the Last Year: Never true    Ran Out of Food in the Last Year: Never true  Transportation Needs: No Transportation Needs (08/07/2024)   PRAPARE - Administrator, Civil Service (Medical): No    Lack of Transportation (Non-Medical): No  Physical Activity: Sufficiently Active (08/07/2024)   Exercise Vital Sign    Days of Exercise per Week: 7 days    Minutes of Exercise per Session: 60 min  Stress: No Stress Concern Present (08/07/2024)   [employer] of Occupational Health - Occupational Stress Questionnaire    Feeling of Stress: Only a little  Social Connections: Moderately Isolated (08/07/2024)   Social Connection and Isolation Panel    Frequency of Communication with Friends and Family: More than three times a week    Frequency of Social Gatherings with Friends and Family: More than three times a week    Attends Religious Services: Never    Database administrator or Organizations: No    Attends Engineer, structural: Not on file    Marital Status: Married    Tobacco Counseling Counseling given: Not Answered    Clinical Intake:  Pre-visit preparation completed: Yes  Pain : No/denies pain     BMI - recorded: 29.78 Nutritional Status: BMI 25 -29 Overweight Nutritional Risks: None Diabetes: No  Lab Results  Component Value Date   HGBA1C 5.1 09/28/2023   HGBA1C 5.2 09/14/2022     How often do you need to have someone help you when you read instructions, pamphlets, or other written materials from your doctor or pharmacy?: 1 - Never  Interpreter Needed?: No  Comments: lives with wife Information entered by :: B.Brookelle Pellicane,LPN   Activities of Daily Living     08/07/2024    1:25 PM  In your present state of health, do you have any difficulty performing the following activities:  Hearing? 0  Vision? 0  Difficulty concentrating or making decisions? 0  Walking or climbing stairs? 0  Dressing or bathing? 0  Doing  errands, shopping? 0  Preparing Food and eating ? N  Using the Toilet? N  In the past six months, have you accidently leaked urine? N  Do you have problems with loss of bowel control? N  Managing your Medications? N  Managing your Finances? N  Housekeeping or managing your Housekeeping? N    Patient Care Team: Rilla Baller, MD as PCP - General (Family Medicine) Laurice Francis NOVAK, OD (Optometry)  I have updated your Care Teams any recent Medical Services you may have received from other providers in the past year.     Assessment:   This is a routine wellness examination for [name].  Hearing/Vision screen Hearing Screening - Comments:: Patient denies any hearing difficulties.   Vision Screening - Comments:: Pt says  their vision is good with glasses for reading Dr  MARLA Nice-UTD w/visit   Goals Addressed             This Visit's Progress    DIET - EAT MORE FRUITS AND VEGETABLES   On track    08/11/24     Patient Stated   On track    08/11/24-Maintain current health status       Depression Screen     08/11/2024   10:28 AM 08/02/2023   10:48 AM 07/28/2022    1:33 PM 07/25/2021    8:57 AM 01/10/2021   10:43 AM  PHQ 2/9 Scores  PHQ - 2 Score 0 0 0 0 0  PHQ- 9 Score   0  1    Fall Risk     08/07/2024    1:25 PM 08/01/2023    8:56 AM 07/28/2022    1:37 PM 07/24/2022    9:59 AM 07/25/2021    8:57 AM  Fall Risk   Falls in the past year? 0 0 0 0 0  Number falls in past yr: 0 0 0 0   Injury with Fall? 0 1 0 0   Risk for fall due to : No Fall Risks No Fall Risks No Fall Risks    Follow up Education provided;Falls prevention discussed Falls prevention discussed;Falls evaluation completed Falls evaluation completed        Data saved with a previous flowsheet row definition    MEDICARE RISK AT HOME:  Medicare Risk at Home Any stairs in or around the home?: (Patient-Rptd) Yes If so, are there any without handrails?: (Patient-Rptd) No Home free of loose throw rugs in walkways, pet  beds, electrical cords, etc?: (Patient-Rptd) Yes Adequate lighting in your home to reduce risk of falls?: (Patient-Rptd) Yes Life alert?: (Patient-Rptd) No Use of a cane, walker or w/c?: (Patient-Rptd) No Grab bars in the bathroom?: (Patient-Rptd) Yes Shower chair or bench in shower?: (Patient-Rptd) No Elevated toilet seat or a handicapped toilet?: (Patient-Rptd) No  TIMED UP AND GO:  Was the test performed?  No  Cognitive Function: 6CIT completed        08/11/2024   10:32 AM 08/02/2023   10:52 AM 07/28/2022    1:40 PM  6CIT Screen  What Year? 0 points 0 points 0 points  What month? 0 points 0 points 0 points  What time? 0 points 0 points 0 points  Count back from 20 0 points 0 points 0 points  Months in reverse 0 points 0 points 0 points  Repeat phrase 4 points 2 points 0 points  Total Score 4 points 2 points 0 points    Immunizations Immunization History  Administered Date(s) Administered   PFIZER(Purple Top)SARS-COV-2 Vaccination 02/03/2020, 02/28/2020, 10/13/2020, 12/07/2021   PNEUMOCOCCAL CONJUGATE-20 07/25/2021   Td 11/23/2013    Screening Tests Health Maintenance  Topic Date Due   Zoster Vaccines- Shingrix (1 of 2) Never done   DTaP/Tdap/Td (2 - Tdap) 11/24/2023   Influenza Vaccine  Never done   COVID-19 Vaccine (5 - 2025-26 season) 07/24/2024   Fecal DNA (Cologuard)  11/07/2024   Medicare Annual Wellness (AWV)  08/11/2025   Pneumococcal Vaccine: 50+ Years  Completed   Hepatitis C Screening  Completed   HPV VACCINES  Aged Out   Meningococcal B Vaccine  Aged Out    Health Maintenance Items Addressed: Pt declines the Covid vaccine, Influenza, Shingles vaccine  Additional Screening:  Vision Screening: Recommended annual ophthalmology exams for early detection  of glaucoma and other disorders of the eye. Is the patient up to date with their annual eye exam?  Yes  Who is the provider or what is the name of the office in which the patient attends annual eye  exams? Dr Laurice  Dental Screening: Recommended annual dental exams for proper oral hygiene  Community Resource Referral / Chronic Care Management: CRR required this visit?  No   CCM required this visit?  Appt scheduled with PCP   Plan:    I have personally reviewed and noted the following in the patient's chart:   Medical and social history Use of alcohol, tobacco or illicit drugs  Current medications and supplements including opioid prescriptions. Patient is not currently taking opioid prescriptions. Functional ability and status Nutritional status Physical activity Advanced directives List of other physicians Hospitalizations, surgeries, and ER visits in previous 12 months Vitals Screenings to include cognitive, depression, and falls Referrals and appointments  In addition, I have reviewed and discussed with patient certain preventive protocols, quality metrics, and best practice recommendations. A written personalized care plan for preventive services as well as general preventive health recommendations were provided to patient.   Erminio LITTIE Saris, LPN   [id number]   After Visit Summary: (MyChart) Due to this being a telephonic visit, the after visit summary with patients personalized plan was offered to patient via MyChart   Notes: Nothing significant to report at this time.

## 2024-08-11 NOTE — Patient Instructions (Signed)
 [name],  Thank you for taking the time for your Medicare Wellness Visit. I appreciate your continued commitment to your health goals. Please review the care plan we discussed, and feel free to reach out if I can assist you further.  Medicare recommends these wellness visits once per year to help you and your care team stay ahead of potential health issues. These visits are designed to focus on prevention, allowing your provider to concentrate on managing your acute and chronic conditions during your regular appointments.  Please note that Annual Wellness Visits do not include a physical exam. Some assessments may be limited, especially if the visit was conducted virtually. If needed, we may recommend a separate in-person follow-up with your provider.  Ongoing Care Seeing your primary care provider every 3 to 6 months helps us  monitor your health and provide consistent, personalized care.   Referrals If a referral was made during today's visit and you haven't received any updates within two weeks, please contact the referred provider directly to check on the status.  Recommended Screenings:  Health Maintenance  Topic Date Due   Zoster (Shingles) Vaccine (1 of 2) Never done   DTaP/Tdap/Td vaccine (2 - Tdap) 11/24/2023   Flu Shot  Never done   COVID-19 Vaccine (5 - 2025-26 season) 07/24/2024   Cologuard (Stool DNA test)  11/07/2024   Medicare Annual Wellness Visit  08/11/2025   Pneumococcal Vaccine for age over 69  Completed   Hepatitis C Screening  Completed   HPV Vaccine  Aged Out   Meningitis B Vaccine  Aged Out       08/02/2023   10:52 AM  Advanced Directives  Does Patient Have a Medical Advance Directive? Yes  Type of Estate agent of [place];Living will  Copy of Healthcare Power of Attorney in Chart? No - copy requested   Advance Care Planning is important because it: Ensures you receive medical care that aligns with your values, goals, and  preferences. Provides guidance to your family and loved ones, reducing the emotional burden of decision-making during critical moments.  Vision: Annual vision screenings are recommended for early detection of glaucoma, cataracts, and diabetic retinopathy. These exams can also reveal signs of chronic conditions such as diabetes and high blood pressure.  Dental: Annual dental screenings help detect early signs of oral cancer, gum disease, and other conditions linked to overall health, including heart disease and diabetes.

## 2024-09-28 ENCOUNTER — Telehealth: Payer: Self-pay

## 2024-09-28 NOTE — Telephone Encounter (Addendum)
 Copied from CRM #[id number]. Topic: General - Other >> Sep 28, 2024 11:44 AM Thersia BROCKS wrote: Reason for CRM: Patient wife called in regarding patient , would like nurse to give her a callback stated she has some concerns  [phone]    I spoke with pts wife (DPR signed) family has been noticing pt acting strangely at times. I asked how pt was acting and pts wife said just not himself and pt daughter is home for 1 week from [place] and pt wife and Leotis found multiple empty liquor bottles at home. They confronted pt and he admitted he has been drinking. Jerel (wife) said pt hides the drinking but lately pt has been sleeping a lot and not eating much. Jerel wants pt to only see Dr KANDICE ASAP while their daughter is here. Pt also has high BP and pt takes BP daily; Jerel does not know readings but per pt BP is High.No CP or SOB. Jerel is not sure if pt is taking his medication. Jerel said at this time pt does not need to go to [facility] or ED. No available appts on 09/29/24. Terry scheduled appt with Dr KANDICE 10/02/24 at 2 PM and pt will ck in at 1:45.UC & ED precautions given and Jerel voiced understanding. Jerel said she and her daughter will monitor pt closely until 10/02/24 appt and will ck reading when pt takes BP and will watch pt take his meds. Jerel said to let Dr KANDICE she wants him to be blunt with pt about not drinking and what pt needs to do when pt comes for appt on 10/02/24.Sending note to Dr KANDICE who is out of office today and Dr Cleatus who is in office and [place] [facility]. I will speak wioth CMA who assisting Dr Cleatus also. After speaking with Dr Cleatus I did let pt's wife know until pt is seen if family stops him from drinking completely he could go thru withdrawals. Jerel voiced understanding and will not stop pt from drinking completely this weeken d but she and her daughter will monitor pts drinking.

## 2024-09-29 ENCOUNTER — Other Ambulatory Visit: Payer: Self-pay | Admitting: Family Medicine

## 2024-09-29 DIAGNOSIS — I10 Essential (primary) hypertension: Secondary | ICD-10-CM

## 2024-09-29 NOTE — Telephone Encounter (Signed)
Noted. Routed to PCP as FYI.

## 2024-09-29 NOTE — Telephone Encounter (Signed)
 Agree with recommendations made - will see on Monday.

## 2024-10-02 ENCOUNTER — Ambulatory Visit (INDEPENDENT_AMBULATORY_CARE_PROVIDER_SITE_OTHER): Admitting: Family Medicine

## 2024-10-02 ENCOUNTER — Encounter: Payer: Self-pay | Admitting: Family Medicine

## 2024-10-02 VITALS — BP 138/70 | HR 85 | Temp 99.0°F | Ht 67.5 in | Wt 198.1 lb

## 2024-10-02 DIAGNOSIS — F1090 Alcohol use, unspecified, uncomplicated: Secondary | ICD-10-CM

## 2024-10-02 DIAGNOSIS — K709 Alcoholic liver disease, unspecified: Secondary | ICD-10-CM

## 2024-10-02 DIAGNOSIS — D563 Thalassemia minor: Secondary | ICD-10-CM | POA: Diagnosis not present

## 2024-10-02 DIAGNOSIS — F5104 Psychophysiologic insomnia: Secondary | ICD-10-CM | POA: Diagnosis not present

## 2024-10-02 DIAGNOSIS — R7401 Elevation of levels of liver transaminase levels: Secondary | ICD-10-CM

## 2024-10-02 DIAGNOSIS — K760 Fatty (change of) liver, not elsewhere classified: Secondary | ICD-10-CM

## 2024-10-02 DIAGNOSIS — R5383 Other fatigue: Secondary | ICD-10-CM | POA: Diagnosis not present

## 2024-10-02 DIAGNOSIS — W57XXXS Bitten or stung by nonvenomous insect and other nonvenomous arthropods, sequela: Secondary | ICD-10-CM

## 2024-10-02 DIAGNOSIS — Z87891 Personal history of nicotine dependence: Secondary | ICD-10-CM

## 2024-10-02 MED ORDER — GABAPENTIN 300 MG PO CAPS: ORAL_CAPSULE | ORAL | 1 refills | Status: DC

## 2024-10-02 NOTE — Patient Instructions (Addendum)
 Labs today  Start gabapentin 300mg  nightly for 3-4 days then increase to twice daily for 3-4 days then increase to 3 times daily.  Keep follow up appointment for the end of the month Good to see you today

## 2024-10-02 NOTE — Progress Notes (Unsigned)
 Ph: [phone] Fax: [phone]   Patient ID: Todd Mcmahon, male    DOB: 08-28-1956, 68 y.o.   MRN: [id number]  This visit was conducted in person.  BP 138/70   Pulse 85   Temp 99 F (37.2 C) (Oral)   Ht 5' 7.5 (1.715 m)   Wt 198 lb 2 oz (89.9 kg)   SpO2 98%   BMI 30.57 kg/m    CC: discuss alcohol use Subjective:   HPI: Todd Mcmahon is a 68 y.o. male presenting on 10/02/2024 for Medical Management of Chronic Issues (Pt CC can't sleep and alcohol use. Pt daughter thinks he looks jaundice. No appetite and coughing up yellow mucus.)   Family recently called with concerns regarding pt's actions not acting himself with concern that he had restarted drinking (found empty liquor bottles at home). Increased sleeping, decreased appetite,   Hypertension managed with amlodipine  10mg  daily.   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   H/o elevated iron levels last year, did not return for repeat testing.  No personal or family h/o iron overload.  No recent blood transfusions, no oral iron replacement.  No periph neuropathy.   Ongoing difficulty sleeping. Only sleeps 2 hours a night.   Alcohol - has increased alcohol intake, attributes to trouble sleeping.  Notes withdrawal - fatigue, irritability.  Appetite down.  He is drinking plenty of water, he uses liquid IV rehydration, he drinks boost supplement.  Has not gone to AA.   No recreational drugs.   CAGE questionnaire - 3/4 positive.   H/o tick bites, last  Endorses chronic fatigue Requests to be tested for lyme disease     Relevant past medical, surgical, family and social history reviewed and updated as indicated. Interim medical history since our last visit reviewed. Allergies and medications reviewed and updated. Outpatient Medications Prior to Visit  Medication Sig Dispense Refill   amLODipine  (NORVASC ) 10 MG tablet TAKE 1 TABLET BY MOUTH  EVERY DAY 30 tablet 0   No facility-administered medications prior to visit.     Per HPI unless specifically indicated in ROS section below Review of Systems  Objective:  BP 138/70   Pulse 85   Temp 99 F (37.2 C) (Oral)   Ht 5' 7.5 (1.715 m)   Wt 198 lb 2 oz (89.9 kg)   SpO2 98%   BMI 30.57 kg/m   Wt Readings from Last 3 Encounters:  10/02/24 198 lb 2 oz (89.9 kg)  08/11/24 193 lb (87.5 kg)  10/08/23 193 lb (87.5 kg)      Physical Exam Vitals and nursing note reviewed.  Constitutional:      Appearance: Normal appearance. He is not ill-appearing.  HENT:     Mouth/Throat:     Mouth: Mucous membranes are moist.     Pharynx: Oropharynx is clear. No oropharyngeal exudate or posterior oropharyngeal erythema.  Eyes:     Extraocular Movements: Extraocular movements intact.     Pupils: Pupils are equal, round, and reactive to light.  Cardiovascular:     Rate and Rhythm: Normal rate and regular rhythm.     Pulses: Normal pulses.     Heart sounds: Normal heart sounds. No murmur heard. Pulmonary:     Effort: Pulmonary effort is normal. No respiratory distress.     Breath sounds: Normal breath sounds. No wheezing, rhonchi or rales.  Abdominal:     General: Bowel sounds are normal. There is  no distension.     Palpations: Abdomen is soft. There is hepatomegaly. There is no mass.     Tenderness: There is no abdominal tenderness. There is no guarding or rebound.     Hernia: No hernia is present.  Musculoskeletal:     Right lower leg: No edema.     Left lower leg: No edema.  Skin:    General: Skin is warm and dry.     Findings: No rash.  Neurological:     Mental Status: He is alert.  Psychiatric:        Mood and Affect: Mood normal.        Behavior: Behavior normal.       Results for orders placed or performed in visit on 10/02/24  Gamma GT   Collection Time: 10/02/24  3:01 PM  Result Value Ref Range   GGT 436 (H) 7 - 51 U/L  IBC panel   Collection Time: 10/02/24   3:01 PM  Result Value Ref Range   Iron 147 42 - 165 ug/dL   Transferrin 811.9 (L) 212.0 - 360.0 mg/dL   Saturation Ratios 44.0 (H) 20.0 - 50.0 %   TIBC 263.2 250.0 - 450.0 mcg/dL  Ferritin   Collection Time: 10/02/24  3:01 PM  Result Value Ref Range   Ferritin >1500.0 (H) 22.0 - 322.0 ng/mL  Hepatic function panel   Collection Time: 10/02/24  3:01 PM  Result Value Ref Range   Total Bilirubin 2.0 (H) 0.2 - 1.2 mg/dL   Bilirubin, Direct 0.8 (H) 0.0 - 0.3 mg/dL   Alkaline Phosphatase 64 39 - 117 U/L   AST 99 (H) 0 - 37 U/L   ALT 72 (H) 0 - 53 U/L   Total Protein 6.1 6.0 - 8.3 g/dL   Albumin 3.8 3.5 - 5.2 g/dL  CBC with Differential/Platelet   Collection Time: 10/02/24  3:01 PM  Result Value Ref Range   WBC 7.5 4.0 - 10.5 K/uL   RBC 3.71 (L) 4.22 - 5.81 Mil/uL   Hemoglobin 9.6 (L) 13.0 - 17.0 g/dL   HCT 69.8 (L) 60.9 - 47.9 %   MCV 81.1 78.0 - 100.0 fl   MCHC 31.9 30.0 - 36.0 g/dL   RDW 81.4 (H) 88.4 - 84.4 %   Platelets 262.0 150.0 - 400.0 K/uL   Neutrophils Relative % 68.2 43.0 - 77.0 %   Lymphocytes Relative 18.7 12.0 - 46.0 %   Monocytes Relative 12.1 (H) 3.0 - 12.0 %   Eosinophils Relative 0.4 0.0 - 5.0 %   Basophils Relative 0.6 0.0 - 3.0 %   Neutro Abs 5.1 1.4 - 7.7 K/uL   Lymphs Abs 1.4 0.7 - 4.0 K/uL   Monocytes Absolute 0.9 0.1 - 1.0 K/uL   Eosinophils Absolute 0.0 0.0 - 0.7 K/uL   Basophils Absolute 0.0 0.0 - 0.1 K/uL  Vitamin B12   Collection Time: 10/02/24  3:01 PM  Result Value Ref Range   Vitamin B-12 667 211 - 911 pg/mL  Folate   Collection Time: 10/02/24  3:01 PM  Result Value Ref Range   Folate 7.8 >5.9 ng/mL  Basic metabolic panel with GFR   Collection Time: 10/02/24  3:01 PM  Result Value Ref Range   Sodium 134 (L) 135 - 145 mEq/L   Potassium 3.9 3.5 - 5.1 mEq/L   Chloride 94 (L) 96 - 112 mEq/L   CO2 30 19 - 32 mEq/L   Glucose, Bld 106 (H) 70 - 99 mg/dL  BUN 13 6 - 23 mg/dL   Creatinine, Ser 9.09 0.40 - 1.50 mg/dL   GFR 12.46 >39.99 mL/min    Calcium 8.8 8.4 - 10.5 mg/dL  Protime-INR   Collection Time: 10/02/24  3:01 PM  Result Value Ref Range   INR 1.1 (H) 0.8 - 1.0 ratio   Prothrombin Time 11.4 9.6 - 13.1 sec    Assessment & Plan:  MELD 3.0: 12 at 10/02/2024  3:01 PM MELD-Na: 10 at 10/02/2024  3:01 PM Calculated from: Serum Creatinine: 0.9 mg/dL (Using min of 1 mg/dL) at 88/89/7974  6:98 PM Serum Sodium: 134 mEq/L at 10/02/2024  3:01 PM Total Bilirubin: 2 mg/dL at 88/89/7974  6:98 PM Serum Albumin: 3.8 g/dL (Using max of 3.5 g/dL) at 88/89/7974  6:98 PM INR(ratio): 1.1 ratio at 10/02/2024  3:01 PM Age at listing (hypothetical): 69 years Sex: Male at 10/02/2024  3:01 PM  Problem List Items Addressed This Visit     Ex-smoker   Quit smoking 09/2022 - continues yearly lung cancer screening.       Beta thalassemia minor   Chronic insomnia   States this is predominant driver of insomnia.  Start gabapentin as per below. Consider trazodone trial.       Transaminitis   Update labs.       Fatty liver   Severe hepatic steatosis on latest MRI 2024 - encouraged alcohol cessation to protect liver.      Relevant Orders   Protime-INR (Completed)   Iron overload   No personal or family history of hereditary hemochromatosis, and previously h/o iron deficiency pointing towards alcohol-induced iron overload. Update levels today.  Consider hemochromatosis testing next labs.       Tick bite   Endorses h/o this and requests testing for lyme disease       Relevant Orders   B. burgdorfi antibodies by WB   Alcohol use disorder   CAGE questions 3/4.  Reviewed health risks of ongoing drinking  He notes symptoms of withdrawal when he goes without drinking.  Reviewed treatment options - Rx gabapentin with titration over a week to 300mg  TID, discussed this is second line agent. Update with effect. Suggesting looking into local AA program.  Encouraged slowly titrating alcohol use to avoid withdrawals.  No h/o DT Reassess  at CPE in 2 weeks.       Relevant Orders   Vitamin B1   Vitamin B12 (Completed)   Folate (Completed)   Basic metabolic panel with GFR (Completed)   Alcoholic liver disease - Primary   Concern for this given persistence of hepatomegaly on exam and persistent drinking.  Update labs today.       Relevant Orders   Basic metabolic panel with GFR (Completed)     Meds ordered this encounter  Medications   gabapentin (NEURONTIN) 300 MG capsule    Sig: Take 1 capsule (300 mg total) by mouth at bedtime for 4 days, THEN 1 capsule (300 mg total) 2 (two) times daily for 4 days, THEN 1 capsule (300 mg total) 3 (three) times daily.    Dispense:  90 capsule    Refill:  1    Orders Placed This Encounter  Procedures   Vitamin B1   Vitamin B12   Folate   Basic metabolic panel with GFR   Protime-INR   B. burgdorfi antibodies by WB    Patient Instructions  Labs today  Start gabapentin 300mg  nightly for 3-4 days then increase to twice daily for 3-4 days  then increase to 3 times daily.  Keep follow up appointment for the end of the month Good to see you today  Follow up plan: Return if symptoms worsen or fail to improve.  Anton Blas, MD

## 2024-10-03 LAB — HEPATIC FUNCTION PANEL
ALT: 72 U/L — ABNORMAL HIGH (ref 0–53)
AST: 99 U/L — ABNORMAL HIGH (ref 0–37)
Albumin: 3.8 g/dL (ref 3.5–5.2)
Alkaline Phosphatase: 64 U/L (ref 39–117)
Bilirubin, Direct: 0.8 mg/dL — ABNORMAL HIGH (ref 0.0–0.3)
Total Bilirubin: 2 mg/dL — ABNORMAL HIGH (ref 0.2–1.2)
Total Protein: 6.1 g/dL (ref 6.0–8.3)

## 2024-10-03 LAB — IBC PANEL
Iron: 147 ug/dL (ref 42–165)
Saturation Ratios: 55.9 % — ABNORMAL HIGH (ref 20.0–50.0)
TIBC: 263.2 ug/dL (ref 250.0–450.0)
Transferrin: 188 mg/dL — ABNORMAL LOW (ref 212.0–360.0)

## 2024-10-03 LAB — CBC WITH DIFFERENTIAL/PLATELET
Basophils Absolute: 0 K/uL (ref 0.0–0.1)
Basophils Relative: 0.6 % (ref 0.0–3.0)
Eosinophils Absolute: 0 K/uL (ref 0.0–0.7)
Eosinophils Relative: 0.4 % (ref 0.0–5.0)
HCT: 30.1 % — ABNORMAL LOW (ref 39.0–52.0)
Hemoglobin: 9.6 g/dL — ABNORMAL LOW (ref 13.0–17.0)
Lymphocytes Relative: 18.7 % (ref 12.0–46.0)
Lymphs Abs: 1.4 K/uL (ref 0.7–4.0)
MCHC: 31.9 g/dL (ref 30.0–36.0)
MCV: 81.1 fl (ref 78.0–100.0)
Monocytes Absolute: 0.9 K/uL (ref 0.1–1.0)
Monocytes Relative: 12.1 % — ABNORMAL HIGH (ref 3.0–12.0)
Neutro Abs: 5.1 K/uL (ref 1.4–7.7)
Neutrophils Relative %: 68.2 % (ref 43.0–77.0)
Platelets: 262 K/uL (ref 150.0–400.0)
RBC: 3.71 Mil/uL — ABNORMAL LOW (ref 4.22–5.81)
RDW: 18.5 % — ABNORMAL HIGH (ref 11.5–15.5)
WBC: 7.5 K/uL (ref 4.0–10.5)

## 2024-10-03 LAB — PROTIME-INR
INR: 1.1 ratio — ABNORMAL HIGH (ref 0.8–1.0)
Prothrombin Time: 11.4 s (ref 9.6–13.1)

## 2024-10-03 LAB — FOLATE: Folate: 7.8 ng/mL (ref >5.9)

## 2024-10-03 LAB — BASIC METABOLIC PANEL WITH GFR
BUN: 13 mg/dL (ref 6–23)
CO2: 30 meq/L (ref 19–32)
Calcium: 8.8 mg/dL (ref 8.4–10.5)
Chloride: 94 meq/L — ABNORMAL LOW (ref 96–112)
Creatinine, Ser: 0.9 mg/dL (ref 0.40–1.50)
GFR: 87.53 mL/min (ref >60.00)
Glucose, Bld: 106 mg/dL — ABNORMAL HIGH (ref 70–99)
Potassium: 3.9 meq/L (ref 3.5–5.1)
Sodium: 134 meq/L — ABNORMAL LOW (ref 135–145)

## 2024-10-03 LAB — VITAMIN B12: Vitamin B-12: 667 pg/mL (ref 211–911)

## 2024-10-03 LAB — FERRITIN: Ferritin: >1500 ng/mL — ABNORMAL HIGH (ref 22.0–322.0)

## 2024-10-03 LAB — GAMMA GT: GGT: 436 U/L — ABNORMAL HIGH (ref 7–51)

## 2024-10-04 ENCOUNTER — Ambulatory Visit: Payer: Self-pay | Admitting: Family Medicine

## 2024-10-04 ENCOUNTER — Encounter: Payer: Self-pay | Admitting: Family Medicine

## 2024-10-04 DIAGNOSIS — K709 Alcoholic liver disease, unspecified: Secondary | ICD-10-CM | POA: Insufficient documentation

## 2024-10-04 DIAGNOSIS — W57XXXA Bitten or stung by nonvenomous insect and other nonvenomous arthropods, initial encounter: Secondary | ICD-10-CM | POA: Insufficient documentation

## 2024-10-04 DIAGNOSIS — F1090 Alcohol use, unspecified, uncomplicated: Secondary | ICD-10-CM | POA: Insufficient documentation

## 2024-10-04 LAB — B. BURGDORFI ANTIBODIES BY WB

## 2024-10-04 NOTE — Assessment & Plan Note (Addendum)
 No personal or family history of hereditary hemochromatosis, and previously h/o iron deficiency pointing towards alcohol-induced iron overload. Update levels today.  Consider hemochromatosis testing next labs.

## 2024-10-04 NOTE — Assessment & Plan Note (Signed)
 Endorses h/o this and requests testing for lyme disease

## 2024-10-04 NOTE — Assessment & Plan Note (Signed)
 States this is predominant driver of insomnia.  Start gabapentin as per below. Consider trazodone trial.

## 2024-10-04 NOTE — Assessment & Plan Note (Addendum)
 CAGE questions 3/4.  Reviewed health risks of ongoing drinking  He notes symptoms of withdrawal when he goes without drinking.  Reviewed treatment options - Rx gabapentin with titration over a week to 300mg  TID, discussed this is second line agent. Update with effect. Suggesting looking into local AA program.  Encouraged slowly titrating alcohol use to avoid withdrawals.  No h/o DT Reassess at CPE in 2 weeks.

## 2024-10-04 NOTE — Telephone Encounter (Signed)
 This was sent via wife's my chart. Have moved to patient chart for documentation.   Hello, Todd Mcmahon is supposed to leave for [place] on Sat. She is willing to stay throughout next week if she can get a flight back. Todd Mcmahon responds much better to her. Todd Mcmahon has other appoints coming up and we wondered if those apts could be moved up to next week so that she can go with him? Also, are there any other apts that can be taken care of while she is here? Thank you very much.

## 2024-10-04 NOTE — Assessment & Plan Note (Signed)
 Quit smoking 09/2022 - continues yearly lung cancer screening.

## 2024-10-04 NOTE — Assessment & Plan Note (Addendum)
 Severe hepatic steatosis on latest MRI 2024 - encouraged alcohol cessation to protect liver.

## 2024-10-04 NOTE — Assessment & Plan Note (Signed)
 Update labs.

## 2024-10-04 NOTE — Assessment & Plan Note (Addendum)
 Concern for this given persistence of hepatomegaly on exam and persistent drinking.  Update labs today.

## 2024-10-05 LAB — VITAMIN B1: Vitamin B1 (Thiamine): <6 nmol/L — ABNORMAL LOW (ref 8–30)

## 2024-10-06 ENCOUNTER — Telehealth: Payer: Self-pay | Admitting: Family Medicine

## 2024-10-06 NOTE — Telephone Encounter (Signed)
 Called wife reviewed all messages. Wife is concerned that there are several additional changes both behavioral and conative. They are concerned that there may be additional issues other than the alcohol. I have scheduled office visit Monday for them to come in and review symptoms. I have reviewed all red words and if any they will get evaluation in ED over the weekend.

## 2024-10-06 NOTE — Telephone Encounter (Signed)
 Received this in wife's my chart as well:   Hello Dr. KANDICE., Todd Mcmahon is here for 1 more week. Todd Mcmahon responds better to her direction. We feel as though something else is wrong with Todd Mcmahon besides the alcohol issue. As we stated we don't know if he is drinking because he is sick or simply sick because he is drinking. We think he needs some type of scan to check on any physical or neurological issues that he may  also have. Next steps for this coming week please . Thank you very much. Todd Mcmahon

## 2024-10-06 NOTE — Telephone Encounter (Signed)
 Noted. Agree with this. B1 was low - I spoke with patient and rec he start supplementation

## 2024-10-06 NOTE — Telephone Encounter (Signed)
 Copied from CRM #[id number]. Topic: Clinical - Refused Triage >> Oct 06, 2024  9:04 AM Alexandria E wrote: Patient/caller voiced complaints of psychological issues potentially related to drinking. Wife, Jerel, called in stating that the patient is having some sort of decline, mentally. She is worried as her daughter is only here for one more week to help out and she is really needing to speak with PCP's nurse about next steps. Jerel would like a phone call back between 10-10:30 to discuss. Declined transfer to triage.

## 2024-10-09 ENCOUNTER — Ambulatory Visit (INDEPENDENT_AMBULATORY_CARE_PROVIDER_SITE_OTHER): Admitting: Family Medicine

## 2024-10-09 ENCOUNTER — Encounter: Payer: Self-pay | Admitting: Family Medicine

## 2024-10-09 ENCOUNTER — Ambulatory Visit: Payer: Self-pay

## 2024-10-09 VITALS — BP 130/72 | HR 84 | Temp 99.3°F | Ht 67.5 in | Wt 203.4 lb

## 2024-10-09 DIAGNOSIS — I10 Essential (primary) hypertension: Secondary | ICD-10-CM | POA: Diagnosis not present

## 2024-10-09 DIAGNOSIS — D563 Thalassemia minor: Secondary | ICD-10-CM

## 2024-10-09 DIAGNOSIS — I2510 Atherosclerotic heart disease of native coronary artery without angina pectoris: Secondary | ICD-10-CM

## 2024-10-09 DIAGNOSIS — F1090 Alcohol use, unspecified, uncomplicated: Secondary | ICD-10-CM | POA: Diagnosis not present

## 2024-10-09 DIAGNOSIS — K709 Alcoholic liver disease, unspecified: Secondary | ICD-10-CM

## 2024-10-09 DIAGNOSIS — F5104 Psychophysiologic insomnia: Secondary | ICD-10-CM

## 2024-10-09 DIAGNOSIS — Z125 Encounter for screening for malignant neoplasm of prostate: Secondary | ICD-10-CM

## 2024-10-09 DIAGNOSIS — E519 Thiamine deficiency, unspecified: Secondary | ICD-10-CM | POA: Insufficient documentation

## 2024-10-09 MED ORDER — THIAMINE HCL 100 MG PO TABS: 100.0000 mg | ORAL_TABLET | Freq: Every day | ORAL | Status: DC

## 2024-10-09 NOTE — Progress Notes (Unsigned)
 Ph: [phone] Fax: [phone]   Patient ID: Todd Mcmahon, male    DOB: 01/23/1956, 68 y.o.   MRN: [id number]  This visit was conducted in person.  BP 130/72   Pulse 84   Temp 99.3 F (37.4 C) (Oral)   Ht 5' 7.5 (1.715 m)   Wt 203 lb 6 oz (92.3 kg)   SpO2 96%   BMI 31.38 kg/m   BP Readings from Last 3 Encounters:  10/09/24 130/72  10/02/24 138/70  10/08/23 (!) 158/80   CC: follow-up visit  Subjective:   HPI: Todd Mcmahon is a 68 y.o. male presenting on 10/09/2024 for Medical Management of Chronic Issues (Pt here for changes. Pt is not sure why he is here.)   See prior notes for details.  Wife and daughter remain concerned.  Daughter brings list of concerns:   Pt denies imbalance, tremors, auditory /visual hallucinations, abdominal pain, nausea/vomiting. Weight gain, leg swelling noted.  Denies blood in stool or urine  He states he has not drank since Thursday. Spoke with wife and daughter - they mention he drank 1/4 bottle whiskey yesterday.   He has started B1 thiamine.      Relevant past medical, surgical, family and social history reviewed and updated as indicated. Interim medical history since our last visit reviewed. Allergies and medications reviewed and updated. Outpatient Medications Prior to Visit  Medication Sig Dispense Refill   amLODipine  (NORVASC ) 10 MG tablet TAKE 1 TABLET BY MOUTH EVERY DAY 30 tablet 0   gabapentin (NEURONTIN) 300 MG capsule Take 1 capsule (300 mg total) by mouth at bedtime for 4 days, THEN 1 capsule (300 mg total) 2 (two) times daily for 4 days, THEN 1 capsule (300 mg total) 3 (three) times daily. 90 capsule 1   gabapentin (NEURONTIN) 300 MG capsule Take 1 capsule (300 mg total) by mouth in the morning AND 2 capsules (600 mg total) at bedtime.     No facility-administered medications prior to visit.     Per HPI unless specifically indicated in ROS section below Review of Systems  Objective:  BP 130/72    Pulse 84   Temp 99.3 F (37.4 C) (Oral)   Ht 5' 7.5 (1.715 m)   Wt 203 lb 6 oz (92.3 kg)   SpO2 96%   BMI 31.38 kg/m   Wt Readings from Last 3 Encounters:  10/09/24 203 lb 6 oz (92.3 kg)  10/02/24 198 lb 2 oz (89.9 kg)  08/11/24 193 lb (87.5 kg)      Physical Exam Vitals and nursing note reviewed.  Constitutional:      Appearance: Normal appearance. He is not ill-appearing.  HENT:     Head: Normocephalic and atraumatic.     Mouth/Throat:     Mouth: Mucous membranes are moist.     Pharynx: Oropharynx is clear. No oropharyngeal exudate or posterior oropharyngeal erythema.  Eyes:     Extraocular Movements: Extraocular movements intact.     Pupils: Pupils are equal, round, and reactive to light.  Cardiovascular:     Rate and Rhythm: Normal rate and regular rhythm.     Pulses: Normal pulses.     Heart sounds: Normal heart sounds. No murmur heard. Pulmonary:     Effort: Pulmonary effort is normal. No respiratory distress.     Breath sounds: Normal breath sounds. No wheezing, rhonchi or rales.  Musculoskeletal:     Cervical back: Normal range of motion and neck supple.  Right lower leg: No edema.     Left lower leg: No edema.  Skin:    Findings: No rash.  Neurological:     Mental Status: He is alert.     Comments:  CN 2-12 intact FTN intact EOMI   Psychiatric:        Mood and Affect: Mood normal.        Behavior: Behavior normal.       Results for orders placed or performed in visit on 10/02/24  Gamma GT   Collection Time: 10/02/24  3:01 PM  Result Value Ref Range   GGT 436 (H) 7 - 51 U/L  IBC panel   Collection Time: 10/02/24  3:01 PM  Result Value Ref Range   Iron 147 42 - 165 ug/dL   Transferrin 811.9 (L) 212.0 - 360.0 mg/dL   Saturation Ratios 44.0 (H) 20.0 - 50.0 %   TIBC 263.2 250.0 - 450.0 mcg/dL  Ferritin   Collection Time: 10/02/24  3:01 PM  Result Value Ref Range   Ferritin >1500.0 (H) 22.0 - 322.0 ng/mL  Hepatic function panel   Collection  Time: 10/02/24  3:01 PM  Result Value Ref Range   Total Bilirubin 2.0 (H) 0.2 - 1.2 mg/dL   Bilirubin, Direct 0.8 (H) 0.0 - 0.3 mg/dL   Alkaline Phosphatase 64 39 - 117 U/L   AST 99 (H) 0 - 37 U/L   ALT 72 (H) 0 - 53 U/L   Total Protein 6.1 6.0 - 8.3 g/dL   Albumin 3.8 3.5 - 5.2 g/dL  CBC with Differential/Platelet   Collection Time: 10/02/24  3:01 PM  Result Value Ref Range   WBC 7.5 4.0 - 10.5 K/uL   RBC 3.71 (L) 4.22 - 5.81 Mil/uL   Hemoglobin 9.6 (L) 13.0 - 17.0 g/dL   HCT 69.8 (L) 60.9 - 47.9 %   MCV 81.1 78.0 - 100.0 fl   MCHC 31.9 30.0 - 36.0 g/dL   RDW 81.4 (H) 88.4 - 84.4 %   Platelets 262.0 150.0 - 400.0 K/uL   Neutrophils Relative % 68.2 43.0 - 77.0 %   Lymphocytes Relative 18.7 12.0 - 46.0 %   Monocytes Relative 12.1 (H) 3.0 - 12.0 %   Eosinophils Relative 0.4 0.0 - 5.0 %   Basophils Relative 0.6 0.0 - 3.0 %   Neutro Abs 5.1 1.4 - 7.7 K/uL   Lymphs Abs 1.4 0.7 - 4.0 K/uL   Monocytes Absolute 0.9 0.1 - 1.0 K/uL   Eosinophils Absolute 0.0 0.0 - 0.7 K/uL   Basophils Absolute 0.0 0.0 - 0.1 K/uL  Vitamin B1   Collection Time: 10/02/24  3:01 PM  Result Value Ref Range   Vitamin B1 (Thiamine) <6 (L) 8 - 30 nmol/L  Vitamin B12   Collection Time: 10/02/24  3:01 PM  Result Value Ref Range   Vitamin B-12 667 211 - 911 pg/mL  Folate   Collection Time: 10/02/24  3:01 PM  Result Value Ref Range   Folate 7.8 >5.9 ng/mL  Basic metabolic panel with GFR   Collection Time: 10/02/24  3:01 PM  Result Value Ref Range   Sodium 134 (L) 135 - 145 mEq/L   Potassium 3.9 3.5 - 5.1 mEq/L   Chloride 94 (L) 96 - 112 mEq/L   CO2 30 19 - 32 mEq/L   Glucose, Bld 106 (H) 70 - 99 mg/dL   BUN 13 6 - 23 mg/dL   Creatinine, Ser 9.09 0.40 - 1.50 mg/dL  GFR 87.53 >60.00 mL/min   Calcium 8.8 8.4 - 10.5 mg/dL  Protime-INR   Collection Time: 10/02/24  3:01 PM  Result Value Ref Range   INR 1.1 (H) 0.8 - 1.0 ratio   Prothrombin Time 11.4 9.6 - 13.1 sec  B. burgdorfi antibodies by WB    Collection Time: 10/02/24  3:01 PM  Result Value Ref Range   B burgdorferi IgG Abs (IB) NEGATIVE NEGATIVE   Lyme Disease 18 kD IgG NON-REACTIVE    Lyme Disease 23 kD IgG NON-REACTIVE    Lyme Disease 28 kD IgG NON-REACTIVE    Lyme Disease 30 kD IgG NON-REACTIVE    Lyme Disease 39 kD IgG NON-REACTIVE    Lyme Disease 41 kD IgG NON-REACTIVE    Lyme Disease 45 kD IgG NON-REACTIVE    Lyme Disease 58 kD IgG NON-REACTIVE    Lyme Disease 66 kD IgG NON-REACTIVE    Lyme Disease 93 kD IgG NON-REACTIVE    B burgdorferi IgM Abs (IB) NEGATIVE NEGATIVE   Lyme Disease 23 kD IgM NON-REACTIVE    Lyme Disease 39 kD IgM NON-REACTIVE    Lyme Disease 41 kD IgM NON-REACTIVE     Assessment & Plan:   Problem List Items Addressed This Visit     Alcoholic liver disease - Primary   Relevant Orders   US  ABDOMEN LIMITED RUQ (LIVER/GB)   Thiamine deficiency     Meds ordered this encounter  Medications   thiamine (VITAMIN B1) 100 MG tablet    Sig: Take 1 tablet (100 mg total) by mouth daily.    Orders Placed This Encounter  Procedures   US  ABDOMEN LIMITED RUQ (LIVER/GB)    Standing Status:   Future    Expiration Date:   10/09/2025    Reason for Exam (SYMPTOM  OR DIAGNOSIS REQUIRED):   hepatomegaly /alcoholic liver disease    Preferred imaging location?:   [facility]    Patient Instructions  You may contact [facility] (inpatient) [phone] or [facility] (outpatient) [phone] for alcohol treatment.   Take thiamine 100mg  twice daily for 1 week then drop to 100mg  once daily.  When you're due to increase gabapentin dose, increase to 300mg  in the morning and 600mg  at night. If this doesn't help sleeping, let me know to start specific sleep medicine (trazodone).  Stop up front to reschedule lab visit from this Wednesday to this Friday. Keep physical appointment next week.  I will order updated liver ultrasound as well.   Follow up plan: Return if symptoms worsen or fail to  improve.  Anton Blas, MD

## 2024-10-09 NOTE — Telephone Encounter (Signed)
Spoke with wife and daughter

## 2024-10-09 NOTE — Patient Instructions (Addendum)
 You may contact [facility] (inpatient) [phone] or [facility] (outpatient) [phone] for alcohol treatment.   Take thiamine 100mg  twice daily for 1 week then drop to 100mg  once daily.  When you're due to increase gabapentin dose, increase to 300mg  in the morning and 600mg  at night. If this doesn't help sleeping, let me know to start specific sleep medicine (trazodone).  Stop up front to reschedule lab visit from this Wednesday to this Friday. Keep physical appointment next week.  I will order updated liver ultrasound as well.

## 2024-10-09 NOTE — Telephone Encounter (Signed)
 Pt daughter states that she speaks to pt daily and has noticed: Pt has been hearing voices. Pt has also been having conversations with his cell phone, per daughter there is no one on the phone. Changes in eating habits, binges at times and avoidance of food at times. Family reports memory issues, both short and long term. Balance coordination, stuttering and speech impairment. Daughter states that he believes he is doing ADL's but per family he is not. Pt will often wear the same clothes and states that he has changed.  Pt has been having swollen feet, weight gain, and yellowing of the eyes and skin, and muscle weakness. Tearful episodes with no provocation. Per daughter pt struggles with simple tasks such as making peanut butter and jelly sandwich, last week took 20 minutes to make a peanut butter and grilled cheese sandwich. Pt has been drinking to sleep, per daughter there is excessive drinking. Wife and daughter are concerned that pt is ticking time bomb. Pt has been drinking and driving, family attempts to take keys.  Family reports he has been jaundice, family attempted multiple times to get pt to ED, pt refuses. Pt has previously scheduled appt for today. Family would like clinic to know that they will come after he is roomed if daughter does not initially come with pt, as being there may be triggering for the pt. Family would like to activate POH and states they are not sure how to do that. Family was advised to call 911 should pt become threat to self or others.   Pt daughters name is Todd Mcmahon, cell phone number is: [phone], pt wife may not be able to answer.   Pt family would also like to know if insurance will cover alcohol treatments.   Disconnected prior to NT, RN placed outgoing call, spoke to daughter and wife.   Copied from CRM [id number]. Topic: Clinical - Red Word Triage >> Oct 09, 2024  8:40 AM Alexandria E wrote: Reason for CRM: Worsening neurological issues over the weekend.  Patient's wife, Todd Mcmahon, called in just to relay how they're going to go about his appointment. Todd Mcmahon is worried that the patient will become very defensive when he comes into his appointment today at 2pm, Todd Mcmahon stated that they will try and have patient check-in alone, and her daughter will follow shortly behind to hand a list of concerns off to PCP or nurse after patient goes back into the room. Daughter is worried that if he gets defensive that the cops might have to come and de-escalate the situation, or he would try and be on his best behavior to prove that there is not anything wrong, in his opinion.Todd Mcmahon is wanting alcohol testing done as well as lab work to check for neurological issues, Todd Mcmahon and Todd Mcmahon (daughter) think it would be best to avoid using the words alcohol or drinking during the appointment as that might trigger a defensive mechanism in the patient. Please only call wife or Todd Mcmahon with any questions, do not call patient.

## 2024-10-10 NOTE — Addendum Note (Signed)
 Addended by: RILLA BALLER on: 10/10/2024 07:13 AM   Modules accepted: Level of Service

## 2024-10-10 NOTE — Assessment & Plan Note (Addendum)
 Reviewed ongoing alcohol use in setting of health consequences is one indicator of alcohol use disorder presence. It is also affecting interpersonal relationships (wife, daughter). He endorses h/o withdrawal symptoms, but no DT. Encourage eventual full cessation.  Provided with contact info on [facility] and [facility] locally

## 2024-10-10 NOTE — Assessment & Plan Note (Addendum)
 Reviewed likely diagnosis in setting of alcohol intake - encouraged slowly cutting down to full cessation. Continue gabapentin as per below. Update RUQ liver US .

## 2024-10-10 NOTE — Assessment & Plan Note (Addendum)
%  saturation 55, ferritin high. Anticipate related to alcoholic liver disease.  No known fmhx HH - but will check hemochromatosis DNR-PCR at next labs.

## 2024-10-10 NOTE — Assessment & Plan Note (Signed)
 This is one of his predominant concerns - states this is main driver for his drinking.  Increase gabapentin to 300/600mg  daily. If ineffective for sleep, consider trazodone trial.

## 2024-10-10 NOTE — Assessment & Plan Note (Signed)
 Check FLP at end of week for fasting labs.

## 2024-10-10 NOTE — Assessment & Plan Note (Signed)
 Levels low - he just started thiamine supplementation Rec 200mg  daily for 1 week then drop to 100mg  daily.  Update B1 levels at next labwork.

## 2024-10-11 ENCOUNTER — Other Ambulatory Visit

## 2024-10-13 ENCOUNTER — Other Ambulatory Visit (INDEPENDENT_AMBULATORY_CARE_PROVIDER_SITE_OTHER)

## 2024-10-13 DIAGNOSIS — I2510 Atherosclerotic heart disease of native coronary artery without angina pectoris: Secondary | ICD-10-CM

## 2024-10-13 DIAGNOSIS — E519 Thiamine deficiency, unspecified: Secondary | ICD-10-CM

## 2024-10-13 DIAGNOSIS — I10 Essential (primary) hypertension: Secondary | ICD-10-CM

## 2024-10-13 DIAGNOSIS — Z125 Encounter for screening for malignant neoplasm of prostate: Secondary | ICD-10-CM

## 2024-10-13 DIAGNOSIS — K709 Alcoholic liver disease, unspecified: Secondary | ICD-10-CM

## 2024-10-13 LAB — COMPREHENSIVE METABOLIC PANEL WITH GFR
ALT: 33 U/L (ref 0–53)
AST: 31 U/L (ref 0–37)
Albumin: 3.6 g/dL (ref 3.5–5.2)
Alkaline Phosphatase: 50 U/L (ref 39–117)
BUN: 9 mg/dL (ref 6–23)
CO2: 27 meq/L (ref 19–32)
Calcium: 8.8 mg/dL (ref 8.4–10.5)
Chloride: 106 meq/L (ref 96–112)
Creatinine, Ser: 0.73 mg/dL (ref 0.40–1.50)
GFR: 93.23 mL/min (ref >60.00)
Glucose, Bld: 118 mg/dL — ABNORMAL HIGH (ref 70–99)
Potassium: 4.5 meq/L (ref 3.5–5.1)
Sodium: 139 meq/L (ref 135–145)
Total Bilirubin: 1.1 mg/dL (ref 0.2–1.2)
Total Protein: 6.1 g/dL (ref 6.0–8.3)

## 2024-10-13 LAB — CBC WITH DIFFERENTIAL/PLATELET
Basophils Absolute: 0.1 K/uL (ref 0.0–0.1)
Basophils Relative: 0.9 % (ref 0.0–3.0)
Eosinophils Absolute: 0.1 K/uL (ref 0.0–0.7)
Eosinophils Relative: 1.4 % (ref 0.0–5.0)
HCT: 30.8 % — ABNORMAL LOW (ref 39.0–52.0)
Hemoglobin: 9.5 g/dL — ABNORMAL LOW (ref 13.0–17.0)
Lymphocytes Relative: 20.7 % (ref 12.0–46.0)
Lymphs Abs: 1.5 K/uL (ref 0.7–4.0)
MCHC: 30.9 g/dL (ref 30.0–36.0)
MCV: 81.1 fl (ref 78.0–100.0)
Monocytes Absolute: 0.5 K/uL (ref 0.1–1.0)
Monocytes Relative: 7.1 % (ref 3.0–12.0)
Neutro Abs: 5.2 K/uL (ref 1.4–7.7)
Neutrophils Relative %: 69.9 % (ref 43.0–77.0)
Platelets: 349 K/uL (ref 150.0–400.0)
RBC: 3.8 Mil/uL — ABNORMAL LOW (ref 4.22–5.81)
RDW: 17.1 % — ABNORMAL HIGH (ref 11.5–15.5)
WBC: 7.4 K/uL (ref 4.0–10.5)

## 2024-10-13 LAB — LIPID PANEL
Cholesterol: 170 mg/dL (ref 0–200)
HDL: 46.4 mg/dL (ref >39.00)
LDL Cholesterol: 105 mg/dL — ABNORMAL HIGH (ref 0–99)
NonHDL: 123.74
Total CHOL/HDL Ratio: 4
Triglycerides: 94 mg/dL (ref 0.0–149.0)
VLDL: 18.8 mg/dL (ref 0.0–40.0)

## 2024-10-13 LAB — TSH: TSH: 0.99 u[IU]/mL (ref 0.35–5.50)

## 2024-10-13 LAB — MICROALBUMIN / CREATININE URINE RATIO
Creatinine,U: 98.3 mg/dL
Microalb Creat Ratio: UNDETERMINED mg/g (ref 0.0–30.0)
Microalb, Ur: <0.7 mg/dL

## 2024-10-13 LAB — URINALYSIS, ROUTINE W REFLEX MICROSCOPIC
Bilirubin Urine: NEGATIVE
Hgb urine dipstick: NEGATIVE
Ketones, ur: NEGATIVE
Leukocytes,Ua: NEGATIVE
Nitrite: NEGATIVE
Specific Gravity, Urine: 1.02 (ref 1.000–1.030)
Total Protein, Urine: NEGATIVE
Urine Glucose: NEGATIVE
Urobilinogen, UA: 1 (ref 0.0–1.0)
pH: 6 (ref 5.0–8.0)

## 2024-10-13 LAB — PSA: PSA: 2.04 ng/mL (ref 0.10–4.00)

## 2024-10-16 ENCOUNTER — Ambulatory Visit: Payer: Self-pay | Admitting: Family Medicine

## 2024-10-17 LAB — VITAMIN B1: Vitamin B1 (Thiamine): 107 nmol/L — ABNORMAL HIGH (ref 8–30)

## 2024-10-18 ENCOUNTER — Encounter: Payer: Self-pay | Admitting: Family Medicine

## 2024-10-18 ENCOUNTER — Ambulatory Visit (INDEPENDENT_AMBULATORY_CARE_PROVIDER_SITE_OTHER): Admitting: Family Medicine

## 2024-10-18 VITALS — BP 138/82 | HR 91 | Temp 98.4°F | Ht 68.25 in | Wt 201.0 lb

## 2024-10-18 DIAGNOSIS — D563 Thalassemia minor: Secondary | ICD-10-CM

## 2024-10-18 DIAGNOSIS — I10 Essential (primary) hypertension: Secondary | ICD-10-CM | POA: Diagnosis not present

## 2024-10-18 DIAGNOSIS — F5104 Psychophysiologic insomnia: Secondary | ICD-10-CM

## 2024-10-18 DIAGNOSIS — Z7189 Other specified counseling: Secondary | ICD-10-CM

## 2024-10-18 DIAGNOSIS — K760 Fatty (change of) liver, not elsewhere classified: Secondary | ICD-10-CM | POA: Diagnosis not present

## 2024-10-18 DIAGNOSIS — K4090 Unilateral inguinal hernia, without obstruction or gangrene, not specified as recurrent: Secondary | ICD-10-CM | POA: Diagnosis not present

## 2024-10-18 DIAGNOSIS — K709 Alcoholic liver disease, unspecified: Secondary | ICD-10-CM | POA: Diagnosis not present

## 2024-10-18 DIAGNOSIS — F1090 Alcohol use, unspecified, uncomplicated: Secondary | ICD-10-CM

## 2024-10-18 DIAGNOSIS — Z0000 Encounter for general adult medical examination without abnormal findings: Secondary | ICD-10-CM

## 2024-10-18 DIAGNOSIS — I7121 Aneurysm of the ascending aorta, without rupture: Secondary | ICD-10-CM

## 2024-10-18 DIAGNOSIS — E66811 Obesity, class 1: Secondary | ICD-10-CM

## 2024-10-18 DIAGNOSIS — E519 Thiamine deficiency, unspecified: Secondary | ICD-10-CM

## 2024-10-18 DIAGNOSIS — Z1211 Encounter for screening for malignant neoplasm of colon: Secondary | ICD-10-CM

## 2024-10-18 MED ORDER — GABAPENTIN 300 MG PO CAPS: 600.0000 mg | ORAL_CAPSULE | Freq: Every day | ORAL | 1 refills | Status: DC

## 2024-10-18 MED ORDER — THIAMINE HCL 100 MG PO TABS: 100.0000 mg | ORAL_TABLET | ORAL | Status: AC

## 2024-10-18 MED ORDER — TRAZODONE HCL 50 MG PO TABS: 25.0000 mg | ORAL_TABLET | Freq: Every evening | ORAL | 6 refills | Status: DC | PRN

## 2024-10-18 MED ORDER — AMLODIPINE BESYLATE 10 MG PO TABS: 10.0000 mg | ORAL_TABLET | Freq: Every day | ORAL | 3 refills | Status: AC

## 2024-10-18 NOTE — Assessment & Plan Note (Addendum)
Chronic, stable.  Continue amlodipine 10 mg daily 

## 2024-10-18 NOTE — Assessment & Plan Note (Signed)
 Preventative protocols reviewed and updated unless pt declined. Discussed healthy diet and lifestyle.

## 2024-10-18 NOTE — Progress Notes (Signed)
 Ph: [phone] Fax: [phone]   Patient ID: Todd Mcmahon, male    DOB: 12/19/1955, 68 y.o.   MRN: [id number]  This visit was conducted in person.  BP 138/82   Pulse 91   Temp 98.4 F (36.9 C) (Oral)   Ht 5' 8.25 (1.734 m)   Wt 201 lb (91.2 kg)   SpO2 96%   BMI 30.34 kg/m   BP Readings from Last 3 Encounters:  10/18/24 138/82  10/09/24 130/72  10/02/24 138/70    CC: CPE Subjective:   HPI: Todd Mcmahon is a 68 y.o. male presenting on 10/18/2024 for Annual Exam   See prior notes for details.  Saw health advisor 07/2024 for medicare wellness visit. Note reviewed.     08/11/2024   10:32 AM 08/02/2023   10:52 AM 07/28/2022    1:40 PM  6CIT Screen  What Year? 0 points 0 points 0 points  What month? 0 points 0 points 0 points  What time? 0 points 0 points 0 points  Count back from 20 0 points 0 points 0 points  Months in reverse 0 points 0 points 0 points  Repeat phrase 4 points 2 points 0 points  Total Score 4 points 2 points 0 points   He has been taking ground ginger, cloves, turmeric, cinnamon, nutmeg, with honey, olive oil and apple cider vinegar, along with whole olives and pepperoncini.   He states he didn't drink for 1+ wk then did drink alcohol yesterday - went to friend's live funeral.  Chronic sleep initiation and maintenance insomnia.  Now on gabapentin  300mg  in am and 300mg  in pm - with poor effect Has tried gummy melatonin without benefit, normally medicates with shot of whiskey.    H/o penetrating aortic ulcer followed regularly by [facility] with yearly AAA ultrasound.  Known 3v CAD, aortic ATH, emphysema, hepatic steatosis.   Preventative: Colon cancer screening - cologuard WNL 10/2021. Requests rpt cologuard - ordered Prostate cancer screening - no nocturia. Yearly PSA.  Lung cancer screening - ~25 PY hx. Started screening CT program 09/2021, last done 05/2024.  Flu shot - declines as he states gets sick for 3d each time he gets it.   COVID vaccine - Pfizer 01/2020, 02/2020, booster 09/2020, bivalent 11/2021 Tetanus shot - thinks ~2015  Prevnar-20 07/2021  Shingrix - to check with pharmacy due to medicare insurance  Advanced directive - reviewed and scanned 09/2024. Wife Jerel followed by children Morene and [place] are [place]. No comment on prolonged life support. Reviewed with patient - full code, would want trial of life support but not prolonged if terminal condition or no chances of recovery.  Seat belt use discussed Sunscreen use discussed. No changing moles on skin. Sleep - 4 hours on average - see above  Smoking - fully quit 2022  Alcohol - continued alcohol use but endorses cutting down - encouraged full cessation  Dentist - due - years ago Eye exam - yearly  Bowel - no constipation  Bladder - no incontinence    Lives with wife Jerel Deanie Leach, son Morene  Occ: Builder  Edu: HS - went to technical school Activity: stays active at work  Diet: good water, fruits/vegetables daily      Relevant past medical, surgical, family and social history reviewed and updated as indicated. Interim medical history since our last visit reviewed. Allergies and medications reviewed and updated. Outpatient Medications Prior to Visit  Medication Sig Dispense Refill   amLODipine  (NORVASC ) 10  MG tablet TAKE 1 TABLET BY MOUTH EVERY DAY 30 tablet 0   gabapentin  (NEURONTIN ) 300 MG capsule Take 1 capsule (300 mg total) by mouth in the morning AND 2 capsules (600 mg total) at bedtime.     thiamine  (VITAMIN B1) 100 MG tablet Take 1 tablet (100 mg total) by mouth daily.     No facility-administered medications prior to visit.     Per HPI unless specifically indicated in ROS section below Review of Systems  Constitutional:  Negative for activity change, appetite change, chills, fatigue, fever and unexpected weight change.  HENT:  Negative for hearing loss.   Eyes:  Negative for visual disturbance.  Respiratory:  Negative  for cough, chest tightness, shortness of breath and wheezing.   Cardiovascular:  Negative for chest pain, palpitations and leg swelling.  Gastrointestinal:  Negative for abdominal distention, abdominal pain, blood in stool, constipation, diarrhea, nausea and vomiting.  Genitourinary:  Negative for difficulty urinating and hematuria.  Musculoskeletal:  Negative for arthralgias, myalgias and neck pain.  Skin:  Negative for rash.  Neurological:  Negative for dizziness, seizures, syncope and headaches.  Hematological:  Negative for adenopathy. Does not bruise/bleed easily.  Psychiatric/Behavioral:  Negative for dysphoric mood. The patient is not nervous/anxious.     Objective:  BP 138/82   Pulse 91   Temp 98.4 F (36.9 C) (Oral)   Ht 5' 8.25 (1.734 m)   Wt 201 lb (91.2 kg)   SpO2 96%   BMI 30.34 kg/m   Wt Readings from Last 3 Encounters:  10/18/24 201 lb (91.2 kg)  10/09/24 203 lb 6 oz (92.3 kg)  10/02/24 198 lb 2 oz (89.9 kg)    Ht Readings from Last 3 Encounters:  10/18/24 5' 8.25 (1.734 m)  10/09/24 5' 7.5 (1.715 m)  10/02/24 5' 7.5 (1.715 m)     Physical Exam Vitals and nursing note reviewed.  Constitutional:      General: He is not in acute distress.    Appearance: Normal appearance. He is well-developed. He is not ill-appearing.  HENT:     Head: Normocephalic and atraumatic.     Right Ear: Hearing, tympanic membrane, ear canal and external ear normal.     Left Ear: Hearing, tympanic membrane, ear canal and external ear normal.     Mouth/Throat:     Mouth: Mucous membranes are moist.     Pharynx: Oropharynx is clear. No oropharyngeal exudate or posterior oropharyngeal erythema.  Eyes:     General: No scleral icterus.    Extraocular Movements: Extraocular movements intact.     Conjunctiva/sclera: Conjunctivae normal.     Pupils: Pupils are equal, round, and reactive to light.  Neck:     Thyroid : No thyroid  mass or thyromegaly.     Vascular: No carotid bruit.   Cardiovascular:     Rate and Rhythm: Normal rate and regular rhythm.     Pulses: Normal pulses.          Radial pulses are 2+ on the right side and 2+ on the left side.     Heart sounds: Normal heart sounds. No murmur heard. Pulmonary:     Effort: Pulmonary effort is normal. No respiratory distress.     Breath sounds: Normal breath sounds. No wheezing, rhonchi or rales.  Abdominal:     General: Bowel sounds are normal. There is no distension.     Palpations: Abdomen is soft. There is no mass.     Tenderness: There is no abdominal tenderness.  There is no guarding or rebound.     Hernia: No hernia is present.  Musculoskeletal:        General: Normal range of motion.     Cervical back: Normal range of motion and neck supple.     Right lower leg: No edema.     Left lower leg: No edema.  Lymphadenopathy:     Cervical: No cervical adenopathy.  Skin:    General: Skin is warm and dry.     Findings: No rash.  Neurological:     General: No focal deficit present.     Mental Status: He is alert and oriented to person, place, and time.     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        Behavior: Behavior normal.        Thought Content: Thought content normal.        Judgment: Judgment normal.       Results for orders placed or performed in visit on 10/13/24  Microalbumin / creatinine urine ratio   Collection Time: 10/13/24  7:28 AM  Result Value Ref Range   Microalb, Ur <0.7 mg/dL   Creatinine,U 01.6 mg/dL   Microalb Creat Ratio Unable to calculate 0.0 - 30.0 mg/g  Urinalysis, Routine w reflex microscopic   Collection Time: 10/13/24  7:28 AM  Result Value Ref Range   Color, Urine YELLOW Yellow;Lt. Yellow;Straw;Dark Yellow;Amber;Green;Red;Brown   APPearance CLEAR Clear;Turbid;Slightly Cloudy;Cloudy   Specific  Gravity, Urine 1.020 1.000 - 1.030   pH 6.0 5.0 - 8.0   Total Protein, Urine NEGATIVE Negative   Urine Glucose NEGATIVE Negative   Ketones, ur NEGATIVE Negative   Bilirubin Urine NEGATIVE Negative   Hgb urine dipstick NEGATIVE Negative   Urobilinogen, UA 1.0 0.0 - 1.0   Leukocytes,Ua NEGATIVE Negative   Nitrite NEGATIVE Negative   WBC, UA 0-2/hpf 0-2/hpf   RBC / HPF 0-2/hpf 0-2/hpf   Squamous Epithelial / HPF Rare(0-4/hpf) Rare(0-4/hpf)  TSH   Collection Time: 10/13/24  7:28 AM  Result Value Ref Range   TSH 0.99 0.35 - 5.50 uIU/mL  PSA   Collection Time: 10/13/24  7:28 AM  Result Value Ref Range   PSA 2.04 0.10 - 4.00 ng/mL  Vitamin B1   Collection Time: 10/13/24  7:28 AM  Result Value Ref Range   Vitamin B1 (Thiamine ) 107 (H) 8 - 30 nmol/L  CBC with Differential/Platelet   Collection Time: 10/13/24  7:28 AM  Result Value Ref Range   WBC 7.4 4.0 - 10.5 K/uL   RBC 3.80 (L) 4.22 - 5.81 Mil/uL   Hemoglobin 9.5 (L) 13.0 - 17.0 g/dL   HCT 69.1 (L) 60.9 - 47.9 %   MCV 81.1 78.0 - 100.0 fl   MCHC 30.9 30.0 - 36.0 g/dL   RDW 82.8 (H) 88.4 - 84.4 %   Platelets 349.0 150.0 - 400.0 K/uL   Neutrophils Relative % 69.9 43.0 - 77.0 %   Lymphocytes Relative 20.7 12.0 - 46.0 %   Monocytes Relative 7.1 3.0 - 12.0 %   Eosinophils Relative 1.4 0.0 - 5.0 %   Basophils Relative 0.9 0.0 - 3.0 %   Neutro Abs 5.2 1.4 - 7.7 K/uL   Lymphs Abs 1.5 0.7 - 4.0 K/uL   Monocytes Absolute 0.5 0.1 -  1.0 K/uL   Eosinophils Absolute 0.1 0.0 - 0.7 K/uL   Basophils Absolute 0.1 0.0 - 0.1 K/uL  Comprehensive metabolic panel with GFR   Collection Time: 10/13/24  7:28 AM  Result Value Ref Range   Sodium 139 135 - 145 mEq/L   Potassium 4.5 3.5 - 5.1 mEq/L   Chloride 106 96 - 112 mEq/L   CO2 27 19 - 32 mEq/L   Glucose, Bld 118 (H) 70 - 99 mg/dL   BUN 9 6 - 23 mg/dL   Creatinine, Ser 9.26 0.40 - 1.50 mg/dL   Total Bilirubin 1.1 0.2 - 1.2 mg/dL   Alkaline Phosphatase 50 39 - 117 U/L   AST 31 0 - 37 U/L    ALT 33 0 - 53 U/L   Total Protein 6.1 6.0 - 8.3 g/dL   Albumin 3.6 3.5 - 5.2 g/dL   GFR 06.76 >39.99 mL/min   Calcium 8.8 8.4 - 10.5 mg/dL  Lipid panel   Collection Time: 10/13/24  7:28 AM  Result Value Ref Range   Cholesterol 170 0 - 200 mg/dL   Triglycerides 05.9 0.0 - 149.0 mg/dL   HDL 53.59 >60.99 mg/dL   VLDL 81.1 0.0 - 59.9 mg/dL   LDL Cholesterol 894 (H) 0 - 99 mg/dL   Total CHOL/HDL Ratio 4    NonHDL 123.74    Lab Results  Component Value Date   VITAMINB12 667 10/02/2024    Lab Results  Component Value Date   FOLATE 7.8 10/02/2024   Lab Results  Component Value Date   HEPCAB NON-REACTIVE 07/18/2021       10/18/2024   12:36 PM 10/09/2024    2:12 PM 10/02/2024    2:17 PM 08/11/2024   10:28 AM 08/02/2023   10:48 AM  Depression screen PHQ 2/9  Decreased Interest 0 1 1 0 0  Down, Depressed, Hopeless 0 0 1 0 0  PHQ - 2 Score 0 1 2 0 0  Altered sleeping 2 1 3     Tired, decreased energy 1 1 2     Change in appetite 0 0 2    Feeling bad or failure about yourself  0 0 1    Trouble concentrating 0 0 1    Moving slowly or fidgety/restless 0 0 0    Suicidal thoughts 0 0 0    PHQ-9 Score 3 3 11     Difficult doing work/chores Somewhat difficult Somewhat difficult Somewhat difficult         10/18/2024   12:37 PM 10/09/2024    2:12 PM 10/02/2024    2:18 PM 01/10/2021   10:43 AM  GAD 7 : Generalized Anxiety Score  Nervous, Anxious, on Edge 1 1 1  0  Control/stop worrying 0 0 1 0  Worry too much - different things 0 0 1 0  Trouble relaxing 0 1 1 0  Restless 1 1 1  0  Easily annoyed or irritable 1 0 1 0  Afraid - awful might happen 0 0 1 0  Total GAD 7 Score 3 3 7  0  Anxiety Difficulty Somewhat difficult Somewhat difficult Somewhat difficult    Assessment & Plan:   Problem List Items Addressed This Visit     Advanced directives, counseling/discussion (Chronic)   Advanced directive - reviewed and scanned 09/2024. Wife Jerel followed by children Morene and  [place] are [place]. No comment on prolonged life support. Reviewed with patient - full code, would want trial of life support but not prolonged if terminal condition  or no chances of recovery.       Health maintenance examination - Primary (Chronic)   Preventative protocols reviewed and updated unless pt declined. Discussed healthy diet and lifestyle.       Hypertension   Chronic, stable. Continue amlodipine  10mg  daily.       Relevant Medications   amLODipine  (NORVASC ) 10 MG tablet   Beta thalassemia minor   H/o this by Hgb electrophoresis (2022) Hgb has dropped from 11s to 9s, MCV has risen - ?related to alcohol use. See below.       Non-recurrent inguinal hernia without obstruction or gangrene   Right sided. Chronic, large and longstanding, pt declines surgical evaluation.       Thoracic ascending aortic aneurysm   Continue monitoring with yearly lung cancer screening CTs       Relevant Medications   amLODipine  (NORVASC ) 10 MG tablet   Obesity, Class I, BMI 30-34.9   Chronic insomnia   Gabapentin  was not as beneficial as desired - will start trazodone  50mg  nightly, update with effect.       Fatty liver   MRI last year showing severe hepatic steatosis.  Pending updated RUQ US .  Rec minimizing alcohol intake as per above.       Iron overload   Ferritin and % saturation levels recently high - thought due to alcohol and severe fatty liver disease.  Pending hemochromatosis DNA PCR r/o HH.       Alcohol use disorder   Family's continued concern however pt minimizes use.  Given health consequences - presume liver disease, anemia - do recommend he continue working towards complete cessation.  May continue gabapentin  300mg  1-2 at bedtime.       Alcoholic liver disease   Hepatomegaly on exam concerning for liver disease  RUQ US  still pending.   MELD 3.0: 12 at 10/02/2024  3:01 PM MELD-Na: 10 at 10/02/2024  3:01 PM Calculated from: Serum Creatinine: 0.9 mg/dL (Using min  of 1 mg/dL) at 88/89/7974  6:98 PM Serum Sodium: 134 mEq/L at 10/02/2024  3:01 PM Total Bilirubin: 2 mg/dL at 88/89/7974  6:98 PM Serum Albumin: 3.8 g/dL (Using max of 3.5 g/dL) at 88/89/7974  6:98 PM INR(ratio): 1.1 ratio at 10/02/2024  3:01 PM Age at listing (hypothetical): 22 years Sex: Male at 10/02/2024  3:01 PM  Fibrosis 4 Score = 1.05 Fib-4 interpretation is not validated for people under 35 or over 20 years of age. However, scores under 2.0 are generally considered low risk.       Thiamine  deficiency   B12, folate levels recently normal B1 thiamine  levels initially low, now high - drop B1 thiamine  100mg  to once weekly      Other Visit Diagnoses       Special screening for malignant neoplasms, colon       Relevant Orders   Cologuard        Meds ordered this encounter  Medications   thiamine  (VITAMIN B1) 100 MG tablet    Sig: Take 1 tablet (100 mg total) by mouth once a week.   traZODone  (DESYREL ) 50 MG tablet    Sig: Take 0.5-1 tablets (25-50 mg total) by mouth at bedtime as needed for sleep.    Dispense:  30 tablet    Refill:  6   gabapentin  (NEURONTIN ) 300 MG capsule    Sig: Take 2 capsules (600 mg total) by mouth at bedtime.    Dispense:  180 capsule    Refill:  1  amLODipine  (NORVASC ) 10 MG tablet    Sig: Take 1 tablet (10 mg total) by mouth daily.    Dispense:  90 tablet    Refill:  3    Orders Placed This Encounter  Procedures   Cologuard    Patient Instructions  Drop vitamin B1 to once a week.  Check with pharmacy about 2 shot shingles series (shingrix) and tetanus shot.  Drop gabapentin  to 300mg  1-2 at bedtime Start trazodone  50mg  1/2-1 tablet nightly for sleep  Continue amlodipine   Continue to abstain from alcohol for liver health Awaiting one more blood test looking for iron overload issue.  Return in 3 months for follow up visit   Follow up plan: Return in about 3 months (around 01/18/2025) for follow up visit.  Anton Blas, MD

## 2024-10-18 NOTE — Assessment & Plan Note (Signed)
 Advanced directive - reviewed and scanned 09/2024. Wife Jerel followed by children Morene and [place] are [place]. No comment on prolonged life support. Reviewed with patient - full code, would want trial of life support but not prolonged if terminal condition or no chances of recovery.

## 2024-10-18 NOTE — Patient Instructions (Addendum)
 Drop vitamin B1 to once a week.  Check with pharmacy about 2 shot shingles series (shingrix) and tetanus shot.  Drop gabapentin  to 300mg  1-2 at bedtime Start trazodone  50mg  1/2-1 tablet nightly for sleep  Continue amlodipine   Continue to abstain from alcohol for liver health Awaiting one more blood test looking for iron overload issue.  Return in 3 months for follow up visit

## 2024-10-21 NOTE — Assessment & Plan Note (Signed)
 B12, folate levels recently normal B1 thiamine  levels initially low, now high - drop B1 thiamine  100mg  to once weekly

## 2024-10-21 NOTE — Assessment & Plan Note (Addendum)
 H/o this by Hgb electrophoresis (2022) Hgb has dropped from 11s to 9s, MCV has risen - ?related to alcohol use. See below.

## 2024-10-21 NOTE — Assessment & Plan Note (Signed)
 Continue monitoring with yearly lung cancer screening CTs

## 2024-10-21 NOTE — Assessment & Plan Note (Signed)
 Ferritin and % saturation levels recently high - thought due to alcohol and severe fatty liver disease.  Pending hemochromatosis DNA PCR r/o HH.

## 2024-10-21 NOTE — Assessment & Plan Note (Addendum)
 Family's continued concern however pt minimizes use.  Given health consequences - presume liver disease, anemia - do recommend he continue working towards complete cessation.  May continue gabapentin  300mg  1-2 at bedtime.

## 2024-10-21 NOTE — Assessment & Plan Note (Addendum)
 Hepatomegaly on exam concerning for liver disease  RUQ US  still pending.   MELD 3.0: 12 at 10/02/2024  3:01 PM MELD-Na: 10 at 10/02/2024  3:01 PM Calculated from: Serum Creatinine: 0.9 mg/dL (Using min of 1 mg/dL) at 88/89/7974  6:98 PM Serum Sodium: 134 mEq/L at 10/02/2024  3:01 PM Total Bilirubin: 2 mg/dL at 88/89/7974  6:98 PM Serum Albumin: 3.8 g/dL (Using max of 3.5 g/dL) at 88/89/7974  6:98 PM INR(ratio): 1.1 ratio at 10/02/2024  3:01 PM Age at listing (hypothetical): 71 years Sex: Male at 10/02/2024  3:01 PM  Fibrosis 4 Score = 1.05 Fib-4 interpretation is not validated for people under 35 or over 22 years of age. However, scores under 2.0 are generally considered low risk.

## 2024-10-21 NOTE — Assessment & Plan Note (Signed)
 Gabapentin  was not as beneficial as desired - will start trazodone  50mg  nightly, update with effect.

## 2024-10-21 NOTE — Assessment & Plan Note (Addendum)
 MRI last year showing severe hepatic steatosis.  Pending updated RUQ US .  Rec minimizing alcohol intake as per above.

## 2024-10-21 NOTE — Assessment & Plan Note (Addendum)
 Right sided. Chronic, large and longstanding, pt declines surgical evaluation.

## 2024-10-23 LAB — HEMOCHROMATOSIS DNA-PCR(C282Y,H63D)

## 2024-10-26 ENCOUNTER — Ambulatory Visit
Admission: RE | Admit: 2024-10-26 | Discharge: 2024-10-26 | Disposition: A | Source: Ambulatory Visit | Attending: Family Medicine | Admitting: Family Medicine

## 2024-10-26 DIAGNOSIS — R1011 Right upper quadrant pain: Secondary | ICD-10-CM | POA: Diagnosis not present

## 2024-10-26 DIAGNOSIS — K709 Alcoholic liver disease, unspecified: Secondary | ICD-10-CM

## 2024-10-31 ENCOUNTER — Telehealth: Payer: Self-pay | Admitting: Family Medicine

## 2024-10-31 NOTE — Telephone Encounter (Signed)
 Copied from CRM #[id number]. Topic: Clinical - Lab/Test Results >> Oct 31, 2024  1:15 PM Donna BRAVO wrote: Reason for CRM: patient asking for US  results. US  was done on 10/26/24   Patient would like a call back regarding the results.  Patient was informed they will receive a call back within 1 business day.

## 2024-10-31 NOTE — Telephone Encounter (Signed)
 Please notify ultrasound has not been read by radiology yet. Once it's read, it will pop up in his mychart. I will try to comment on results within a few days of the radiology read.

## 2024-11-01 NOTE — Telephone Encounter (Signed)
 Called patient reviewed all information and repeated back to me. Will call if any questions.  ? ?

## 2024-11-06 ENCOUNTER — Telehealth: Payer: Self-pay

## 2024-11-06 DIAGNOSIS — F5104 Psychophysiologic insomnia: Secondary | ICD-10-CM

## 2024-11-06 NOTE — Telephone Encounter (Signed)
 Called patient wanted to know if he can stop the Gabapentin  and Trazodone . He can't tell improvement with either one. He just feel off when he takes them. He is taking trazodone  2 before bed. Gabapentin  1 tab twice a day

## 2024-11-06 NOTE — Telephone Encounter (Signed)
 Copied from CRM [id number]. Topic: Clinical - Medication Question >> Nov 06, 2024 12:31 PM Delon T wrote: Reason for CRM: sleep medication not working, asking if he can be weaned off of them, please call [phone]

## 2024-11-07 NOTE — Addendum Note (Signed)
 Addended by: RILLA BALLER on: 11/07/2024 03:15 PM   Modules accepted: Orders

## 2024-11-07 NOTE — Telephone Encounter (Addendum)
 Spoke with patient. He was taking gabapentin  600mg  at night and trazodone  50mg  1/2 at night.  Will drop gabapentin  to 300mg  nightly x2 wks then stop. He can stop trazodone .  Offered trial hydroxyzine PRN sleep.   He notes he's sleeping better since starting work routine.  Notes still only drinking a few beers off and on.

## 2024-11-25 LAB — COLOGUARD: COLOGUARD: NEGATIVE

## 2024-11-27 ENCOUNTER — Ambulatory Visit: Payer: Self-pay | Admitting: Family Medicine

## 2025-01-19 ENCOUNTER — Ambulatory Visit: Admitting: Family Medicine

## 2025-08-13 ENCOUNTER — Ambulatory Visit
